# Patient Record
Sex: Female | Born: 1995
Health system: Southern US, Community
[De-identification: ages and names within clinical notes are randomized; demographics above are authoritative.]

## PROBLEM LIST (undated history)

## (undated) DIAGNOSIS — R569 Unspecified convulsions: Secondary | ICD-10-CM

## (undated) DIAGNOSIS — D219 Benign neoplasm of connective and other soft tissue, unspecified: Secondary | ICD-10-CM

## (undated) HISTORY — DX: Benign neoplasm of connective and other soft tissue, unspecified: D21.9

## (undated) HISTORY — DX: Unspecified convulsions: R56.9

## (undated) HISTORY — PX: NO PAST SURGERIES: SHX2092

---
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
## 2013-01-02 ENCOUNTER — Other Ambulatory Visit: Payer: Self-pay | Admitting: Family

## 2013-01-02 DIAGNOSIS — G40209 Localization-related (focal) (partial) symptomatic epilepsy and epileptic syndromes with complex partial seizures, not intractable, without status epilepticus: Secondary | ICD-10-CM

## 2013-01-02 DIAGNOSIS — G40309 Generalized idiopathic epilepsy and epileptic syndromes, not intractable, without status epilepticus: Secondary | ICD-10-CM

## 2013-01-06 ENCOUNTER — Other Ambulatory Visit: Payer: Self-pay

## 2013-01-06 DIAGNOSIS — G40309 Generalized idiopathic epilepsy and epileptic syndromes, not intractable, without status epilepticus: Secondary | ICD-10-CM

## 2013-01-06 DIAGNOSIS — G40209 Localization-related (focal) (partial) symptomatic epilepsy and epileptic syndromes with complex partial seizures, not intractable, without status epilepticus: Secondary | ICD-10-CM

## 2013-01-06 MED ORDER — LEVETIRACETAM 250 MG PO TABS: ORAL_TABLET | ORAL | Status: DC

## 2013-05-20 ENCOUNTER — Ambulatory Visit (INDEPENDENT_AMBULATORY_CARE_PROVIDER_SITE_OTHER): Payer: 59 | Admitting: Family Medicine

## 2013-05-20 VITALS — BP 106/74 | HR 81 | Temp 97.8°F | Resp 16 | Ht 61.0 in | Wt 109.0 lb

## 2013-05-20 DIAGNOSIS — R5383 Other fatigue: Secondary | ICD-10-CM

## 2013-05-20 DIAGNOSIS — J019 Acute sinusitis, unspecified: Secondary | ICD-10-CM

## 2013-05-20 DIAGNOSIS — R5381 Other malaise: Secondary | ICD-10-CM

## 2013-05-20 LAB — POCT URINALYSIS DIPSTICK
Bilirubin, UA: NEGATIVE
Glucose, UA: NEGATIVE
KETONES UA: NEGATIVE
LEUKOCYTES UA: NEGATIVE
Nitrite, UA: NEGATIVE
PROTEIN UA: NEGATIVE
RBC UA: NEGATIVE
Spec Grav, UA: 1.01
UROBILINOGEN UA: 0.2
pH, UA: 5.5

## 2013-05-20 LAB — POCT UA - MICROSCOPIC ONLY
BACTERIA, U MICROSCOPIC: NEGATIVE
Casts, Ur, LPF, POC: NEGATIVE
Crystals, Ur, HPF, POC: NEGATIVE
MUCUS UA: NEGATIVE
YEAST UA: NEGATIVE

## 2013-05-20 LAB — POCT URINE PREGNANCY: Preg Test, Ur: NEGATIVE

## 2013-05-20 MED ORDER — DOXYCYCLINE HYCLATE 100 MG PO CAPS: 100.0000 mg | ORAL_CAPSULE | Freq: Two times a day (BID) | ORAL | Status: DC

## 2013-05-20 NOTE — Patient Instructions (Signed)
Good to see you today- use the doxycycline as directed.  I will be in touch regarding your mono test when it comes in

## 2013-05-20 NOTE — Progress Notes (Addendum)
[facility] [address] [place] [address] [phone]  Date:  05/20/2013   Name:  Cheryl [name]   DOB:  Nov 04, 1995   MRN:  [id number]  PCP:  Lamar Blinks, MD    Chief Complaint: Sore Throat, Headache and Sinus Congestion   History of Present Illness:  Cheryl [name] is a 18 y.o. very pleasant female patient who presents with the following:  Here today with illness.  For the last couple of weeks she has noted a ST, ears feels clogged and she may hear ringing in her ears. Se also notes some pressure behind her eyes, more the left.  Vision is ok.   She is blowing some mucus out of her nose.   She has tried vitamin C.  She did not have a cough until today- today she noted a dry cough and scratchy throat.    No GI symptoms.   She has not had a fever, but has noted some chills.    She thinks she is actually a little better today.   LMP 05/13/13  History of seizure disorder. She did have a seizure (admits she was not taking her meds like she should have been) on 04/24/13.  She reports that she did report this to her neurologist.    She would like to have a urine exam "to make sure it looks ok"   Patient Active Problem List   Diagnosis Date Noted  . Localization-related (focal) (partial) epilepsy and epileptic syndromes with complex partial seizures, without mention of intractable epilepsy 11/22/2012  . Generalized convulsive epilepsy without mention of intractable epilepsy 11/22/2012    Past Medical History  Diagnosis Date  . Seizures     History reviewed. No pertinent past surgical history.  History  Substance Use Topics  . Smoking status: Never Smoker   . Smokeless tobacco: Never Used  . Alcohol Use: No    History reviewed. No pertinent family history.  No Known Allergies  Medication list has been reviewed and updated.  Current Outpatient Prescriptions on File Prior to Visit  Medication Sig Dispense Refill  . diazepam (DIASTAT ACUDIAL) 10 MG  GEL Place rectally once. Give 10mg  rectally for seizures lasting 2 minutes or longer      . levETIRAcetam (KEPPRA) 250 MG tablet Take 2 tabs by mouth every morning and 3 at bedtime  155 tablet  5   No current facility-administered medications on file prior to visit.    Review of Systems:  As per HPI- otherwise negative.   Physical Examination: Filed Vitals:   05/20/13 1237  BP: 106/74  Pulse: 81  Temp: 97.8 F (36.6 C)  Resp: 16   Filed Vitals:   05/20/13 1237  Height: 5\' 1"  (1.549 m)  Weight: 109 lb (49.442 kg)   Body mass index is 20.61 kg/(m^2). Ideal Body Weight: Weight in (lb) to have BMI = 25: 132  GEN: WDWN, NAD, Non-toxic, A & O x 3, looks well HEENT: Atraumatic, Normocephalic. Neck supple. No masses, No LAD. Bilateral TM wnl, oropharynx normal.  PEERL,EOMI.   Tender over left sinuses  Ears and Nose: No external deformity. CV: RRR, No M/G/R. No JVD. No thrill. No extra heart sounds. PULM: CTA B, no wheezes, crackles, rhonchi. No retractions. No resp. distress. No accessory muscle use. ABD: S, NT, ND, +BS. No rebound. No HSM. EXTR: No c/c/e NEURO Normal gait.  PSYCH: Normally interactive. Conversant. Not depressed or anxious appearing.  Calm demeanor.   Results for orders  placed in visit on 05/20/13  POCT UA - MICROSCOPIC ONLY      Result Value Range   WBC, Ur, HPF, POC 0-2     RBC, urine, microscopic 0-2     Bacteria, U Microscopic neg     Mucus, UA neg     Epithelial cells, urine per micros 1-3     Crystals, Ur, HPF, POC neg     Casts, Ur, LPF, POC neg     Yeast, UA neg    POCT URINALYSIS DIPSTICK      Result Value Range   Color, UA yellow     Clarity, UA clear     Glucose, UA neg     Bilirubin, UA neg     Ketones, UA neg     Spec Grav, UA 1.010     Blood, UA neg     pH, UA 5.5     Protein, UA neg     Urobilinogen, UA 0.2     Nitrite, UA neg     Leukocytes, UA Negative    POCT URINE PREGNANCY      Result Value Range   Preg Test, Ur Negative       Assessment and Plan: Sinusitis, acute - Plan: doxycycline (VIBRAMYCIN) 100 MG capsule  Other malaise and fatigue - Plan: Epstein-Barr virus VCA antibody panel, POCT UA - Microscopic Only, POCT urinalysis dipstick, POCT urine pregnancy  Probably sinusitis.  Will use doxycycline due to her history of seizures.   She would like to rule- out EBV so will do this titer for her.  Will plan further follow- up pending labs.  Signed Lamar Blinks, MD  2/11: called and LMOM.  Her EBV titer shows that she has had mono already.  Let me know if not better soon

## 2013-05-21 LAB — EPSTEIN-BARR VIRUS VCA ANTIBODY PANEL
EBV EA IgG: 8.9 U/mL (ref <9.0)
EBV NA IGG: 204 U/mL — AB (ref <18.0)
EBV VCA IgG: >750 U/mL — ABNORMAL HIGH (ref <18.0)

## 2013-08-02 ENCOUNTER — Other Ambulatory Visit: Payer: Self-pay | Admitting: Family

## 2013-12-26 ENCOUNTER — Other Ambulatory Visit: Payer: Self-pay | Admitting: Family

## 2013-12-26 ENCOUNTER — Encounter: Payer: Self-pay | Admitting: Family

## 2013-12-26 ENCOUNTER — Ambulatory Visit (INDEPENDENT_AMBULATORY_CARE_PROVIDER_SITE_OTHER): Payer: 59 | Admitting: Family

## 2013-12-26 VITALS — BP 108/76 | HR 82 | Ht 61.0 in | Wt 110.6 lb

## 2013-12-26 DIAGNOSIS — G40309 Generalized idiopathic epilepsy and epileptic syndromes, not intractable, without status epilepticus: Secondary | ICD-10-CM

## 2013-12-26 DIAGNOSIS — G40209 Localization-related (focal) (partial) symptomatic epilepsy and epileptic syndromes with complex partial seizures, not intractable, without status epilepticus: Secondary | ICD-10-CM

## 2013-12-26 MED ORDER — LEVETIRACETAM 250 MG PO TABS: ORAL_TABLET | ORAL | Status: DC

## 2013-12-26 NOTE — Progress Notes (Signed)
Patient: Cheryl [name] Cheryl [name] [name] MRN: [id number] Sex: female DOB: [birth year]  Provider: Rockwell Germany, NP Location of Care: [facility]  Note type: Routine return visit  History of Present Illness: Referral Source: Dr. Janett Billow Copland History from: patient Chief Complaint: Epilepsy  Cheryl [name] Cheryl [name] [name] is a 18 y.o. young woman with history of complex partial seizures with secondary generalization. She was last seen November 22, 2012. Cheryl [name] Cheryl [name] [name] is taking and tolerating Levetiracetam. She tells me today that she had a seizure on April 24, 2013 while at work and another on October 11, 2013. For the one in January, she said that she had gotten off schedule with her medication and was taking it late, which I suspect means that she had missed a dose because of working various shifts. 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 She said that the seizure lasted about a minute. For the seizure that occurred on July 4th, she said that she had not take her medication for about 4 days, because she was emotionally upset and didn't think about taking it. She then had a seizure that lasted about a minute. She said that she was not injured in that seizure. All of her prior seizures have occurred in the setting of missing doses of medication.   Cheryl [name] Cheryl [name] [name] had an EEG in July, 2011 that was normal. At that time, Dr. [name] talked with Cheryl [name] Cheryl [name] [name] and planned for her to wait until April, 2012 to taper off of the medication. Her Cheryl [name] [name] decided to wait until school was out to taper off Keppra. She started tapering in the summer, was off medication for one day and had a seizure on December 20, 2010. She restarted medication and has had seizures since in the setting of missed medication as mentioned.    Cheryl [name] Cheryl [name] [name] has been otherwise healthy. 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 She said that she is thinking of changing her major to business or marketing.   Review of  Systems: 12 system review was unremarkable  Past Medical History  Diagnosis Date  . Seizures    Hospitalizations: No., Head Injury: No., Nervous System Infections: No., Immunizations up to date: Yes.   Past Medical History Comments: CT scan of the brain was normal May 13, 2006 MRI scan of the brain was normal but showed extensive ethmoid and maxillary sinusitis May 14, 2006  EEG May 14, 2006 was normal awake and asleep. EEG in July 2011 was also normal.  The patient was thought to have a generalized seizure disorder, but then had focal seizures involving twitching of her eyes and gazing at the ceiling. She was originally on Depakote but switched from Depakote to Levetiracetam and had good seizure control when she is compliant with medication.  Surgical History History reviewed. No pertinent past surgical history.  Family History family history is not on file. Family History is otherwise negative for migraines, seizures, cognitive impairment, blindness, deafness, birth defects, chromosomal disorder, autism.  Social History History   Social History  . Marital Status: Single    Spouse Name: N/A    Number of Children: N/A  . Years of Education: N/A   Social History Main Topics  . Smoking status: Never Smoker   . Smokeless tobacco: Never Used  . Alcohol Use: No  . Drug Use: No  . Sexual Activity: No   Other Topics Concern  . None   Social History Narrative  . None  Educational level: Engineer, petroleum Living with:  Cheryl [name] [name] and brother  Hobbies/Interest: singing karaoke School comments:  Cheryl [name] [name] is doing well in school. She is in her freshman year at [employer] and would like to major in business.  Physical Exam BP 108/76  Pulse 82  Ht 5\' 1"  (1.549 m)  Wt 110 lb 9.6 oz (50.168 kg)  BMI 20.91 kg/m2  LMP 12/20/2013 General: alert, well developed, well nourished young woman, in no acute distress, right-handed  Head: normocephalic, no dysmorphic  features  Ears, Nose and Throat: Otoscopic: tympanic membranes normal . Pharynx: oropharynx is pink without exudates or tonsillar hypertrophy.  Neck: supple, full range of motion, no cranial or cervical bruits  Respiratory: auscultation clear  Cardiovascular: soft systolic ejection murmur at the left sternal border, pulses are normal  Musculoskeletal: no skeletal deformities or apparent scoliosis  Skin: no rashes or neurocutaneous lesions   Neurologic Exam  Mental Status: alert; oriented to person, place, and year; knowledge is normal for age; language is normal  Cranial Nerves: visual fields are full to double simultaneous stimuli; extraocular movements are full and conjugate; pupils are round reactive to light; funduscopic examination shows sharp disc margins with normal vessels; symmetric facial strength; midline tongue and uvula; hearing is intact and symmetric  Motor: Normal strength, tone, and mass; good fine motor movements; no pronator drift.  Sensory: intact responses to touch and temperature  Coordination: good finger-to-nose, rapid repetitive alternating movements and finger apposition  Gait and Station: normal gait and station; patient is able to walk on heels, toes and tandem without difficulty; balance is adequate; Romberg exam is negative; Gower response is negative  Reflexes: symmetric and diminished bilaterally; no clonus; bilateral flexor plantar responses.   Assessment and Plan Cheryl [name] is a 18 year old young woman with complex partial seizures with secondary generalization. She is taking and tolerating Levetiracetam. She had 2 seizures in the past year in the setting of missed doses of medication. I talked with her about being more compliant and she agreed to work on that. 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 Cheryl [name] [name] was undecided  what she wants to do at this point and will let me know. I will see her back in follow up in 1 year or will help her to transition to [facility] to the care of Dr Delice Lesch at that point if she chooses to do so.

## 2013-12-26 NOTE — Patient Instructions (Addendum)
Continue taking Levetiracetam 250mg  2 tablets in the morning and 3 tablets at night. Work on not missing any doses of your medication. Let me know if you have any seizures.   Please return for follow up in 1 year. If you decide that you want to transition to an adult neurology practice, let me know and I will facilitate that.

## 2014-07-02 ENCOUNTER — Other Ambulatory Visit: Payer: Self-pay | Admitting: Family

## 2014-12-28 ENCOUNTER — Ambulatory Visit: Payer: Self-pay | Admitting: Family

## 2015-01-05 ENCOUNTER — Ambulatory Visit: Payer: Self-pay | Admitting: Family

## 2015-01-11 ENCOUNTER — Encounter: Payer: Self-pay | Admitting: Family

## 2015-01-11 ENCOUNTER — Ambulatory Visit (INDEPENDENT_AMBULATORY_CARE_PROVIDER_SITE_OTHER): Payer: BLUE CROSS/BLUE SHIELD | Admitting: Family

## 2015-01-11 VITALS — BP 104/74 | HR 82 | Ht 61.0 in | Wt 110.2 lb

## 2015-01-11 DIAGNOSIS — G40209 Localization-related (focal) (partial) symptomatic epilepsy and epileptic syndromes with complex partial seizures, not intractable, without status epilepticus: Secondary | ICD-10-CM | POA: Diagnosis not present

## 2015-01-11 DIAGNOSIS — G40309 Generalized idiopathic epilepsy and epileptic syndromes, not intractable, without status epilepticus: Secondary | ICD-10-CM | POA: Diagnosis not present

## 2015-01-11 MED ORDER — LEVETIRACETAM 250 MG PO TABS: ORAL_TABLET | ORAL | Status: DC

## 2015-01-11 NOTE — Patient Instructions (Signed)
Continue taking the Levetiracetam as you have been taking it. Try not to miss any doses.   Call me if you have any seizures.   Call me in July 2017 if you have remained seizure free and want to schedule an EEG to see if you can taper off your medication.   Otherwise, please plan to return for follow up in 1 year or sooner if needed.

## 2015-01-11 NOTE — Progress Notes (Signed)
Patient: Cheryl [name] MRN: [id number] Sex: female DOB: 10-31-1995  Provider: Rockwell Germany, NP Location of Care: [facility]  Note type: Routine return visit  History of Present Illness: Referral Source: Silvestre Mesi, MD History from: mother, patient and [facility] chart Chief Complaint: Epilepsy  Cheryl [name] is a 19 y.o. young woman with history of generalized convulsive and complex partial seizures with secondary generalization. She was last seen December 26, 2013. Cheryl [name] is taking and tolerating Levetiracetam. Her last seizure occurred on October 11, 2013 in the setting of not taking her medication for 4 days prior and emotional upset. Since being on antiepileptic medication, all of her prior seizures have occurred in the setting of missing doses of medication.   Cheryl [name] had an EEG in July, 2011 that was normal. At that time, Dr. [name] talked with [name] mother and planned for her to wait until April, 2012 to taper off of the medication. Her mother decided to wait until school was out to taper off Keppra. She started tapering in the summer, was off medication for one day and had a seizure on December 20, 2010. She restarted medication and has had seizures since in the setting of missed medication as mentioned.   Cheryl [name] has been otherwise healthy. 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 She said that she is thinking of changing her major to music management and marketing. 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   Cheryl [name] and her mother ask if she could try coming off medication again, and if she she should stay on medication the rest of her life. Her mother also asked if poor sleep patterns as a very young child could have cause her seizure disorder.   Neither Cheryl [name] nor her mother have other health concerns today for her other than previously mentioned.  Review of Systems: Please see the HPI for neurologic and other pertinent review of systems.  Otherwise, the following systems are noncontributory including constitutional, eyes, ears, nose and throat, cardiovascular, respiratory, gastrointestinal, genitourinary, musculoskeletal, skin, endocrine, hematologic/lymph, allergic/immunologic and psychiatric.   Past Medical History  Diagnosis Date  . Seizures ([place])    Hospitalizations: No., Head Injury: No., Nervous System Infections: No., Immunizations up to date: Yes.   Past Medical History Comments: CT scan of the brain was normal May 13, 2006 MRI scan of the brain was normal but showed extensive ethmoid and maxillary sinusitis May 14, 2006 EEG May 14, 2006 was normal awake and asleep. EEG in July 2011 was also normal. The patient was thought to have a generalized seizure disorder, but then had focal seizures involving twitching of her eyes and gazing at the ceiling. She was originally on Depakote but switched from Depakote to Levetiracetam and had good seizure control when she is compliant with medication.  Surgical History History reviewed. No pertinent past surgical history.  Family History family history is not on file. Family History is otherwise negative for migraines, seizures, cognitive impairment, blindness, deafness, birth defects, chromosomal disorder, autism.  Social History Social History   Social History  . Marital Status: Single    Spouse Name: N/A  . Number of Children: N/A  . Years of Education: N/A   Social History Main Topics  . Smoking status: Never Smoker   . Smokeless tobacco: Never Used  . Alcohol Use: Yes     Comment: One or twice a month-Socially  . Drug Use: No  . Sexual Activity: Yes   Other  Topics Concern  . None   Social History Narrative   Cheryl [name] is currently enrolled at [employer] in her second year of college and works at [employer] 24 hours a week.   Cheryl [name] lives with her mother and sibling.   Cheryl [name] enjoys music, reading, and walking downtown.   Cheryl [name] is doing well in school.  Allergies No  Known Allergies  Physical Exam Ht 5\' 1"  (1.549 m)  Wt 110 lb 3.2 oz (49.986 kg)  BMI 20.83 kg/m2  LMP 01/08/2015 General: alert, well developed, well nourished young woman, in no acute distress, right-handed  Head: normocephalic, no dysmorphic features  Ears, Nose and Throat: Otoscopic: tympanic membranes normal . Pharynx: oropharynx is pink without exudates or tonsillar hypertrophy.  Neck: supple, full range of motion, no cranial or cervical bruits  Respiratory: auscultation clear  Cardiovascular: soft systolic ejection murmur at the left sternal border, pulses are normal  Musculoskeletal: no skeletal deformities or apparent scoliosis  Skin: no rashes or neurocutaneous lesions   Neurologic Exam  Mental Status: alert; oriented to person, place, and year; knowledge is normal for age; language is normal  Cranial Nerves: visual fields are full to double simultaneous stimuli; extraocular movements are full and conjugate; pupils are round reactive to light; funduscopic examination shows sharp disc margins with normal vessels; symmetric facial strength; midline tongue and uvula; hearing is intact and symmetric  Motor: Normal strength, tone, and mass; good fine motor movements; no pronator drift.  Sensory: intact responses to touch and temperature  Coordination: good finger-to-nose, rapid repetitive alternating movements and finger apposition  Gait and Station: normal gait and station; patient is able to walk on heels, toes and tandem without difficulty; balance is adequate; Romberg exam is negative; Gower response is negative  Reflexes: symmetric and diminished bilaterally; no clonus; bilateral flexor plantar responses.   Impression 1. Generalized convulsive epilepsy 2. Complex partial seizures with secondary generalization  Recommendations for plan of care The patient's previous [facility] were reviewed. Kelena has neither had nor required imaging or lab studies since the last  visit. She is a 19 year old young woman with complex partial seizures with secondary generalization. She is taking and tolerating Levetiracetam and her last seizure occurred in July 2015 in the setting of missed doses of medication. I talked with Cheryl [name] and her mother at some length today regarding her seizure disorder. I explained that if she remains seizure free through July 2017 that we can perform an EEG to evaluate the feasibility of her tapering off medication. I explained to her about being compliant with medication, remaining seizure free and driving privileges. If she has a normal EEG and attempts to taper off her medication next year, she will be restricted from driving for 6 months from the date that she begins the medication taper. I also talked with her about the importance of a regular sleep schedule and sleep deprivation in triggering seizures. She will continue her medication for now and will return for follow up in 1 year or sooner if needed. If she remains seizure free as of July 2017 and wants to attempt a taper off medication, I asked her to call me so that I can schedule an EEG. I also asked her to let me know if she has any seizures in the interim.  Cheryl [name] and her mother agreed with this plan.  The medication list was reviewed and reconciled.  No changes were made in the prescribed medications today.  A complete medication list was provided to  the patient.  Dr. [name] was consulted regarding the patient.   Total time spent with the patient was 30 minutes, of which 50% or more was spent in counseling and coordination of care.

## 2015-02-11 ENCOUNTER — Other Ambulatory Visit: Payer: Self-pay | Admitting: Pediatrics

## 2015-04-30 ENCOUNTER — Encounter: Payer: Self-pay | Admitting: Family Medicine

## 2015-05-05 ENCOUNTER — Encounter: Payer: Self-pay | Admitting: Family Medicine

## 2015-09-19 ENCOUNTER — Other Ambulatory Visit: Payer: Self-pay | Admitting: Family

## 2016-01-11 ENCOUNTER — Ambulatory Visit (INDEPENDENT_AMBULATORY_CARE_PROVIDER_SITE_OTHER): Payer: BLUE CROSS/BLUE SHIELD | Admitting: Family

## 2016-01-11 ENCOUNTER — Encounter (INDEPENDENT_AMBULATORY_CARE_PROVIDER_SITE_OTHER): Payer: Self-pay | Admitting: Family

## 2016-01-11 VITALS — BP 100/70 | HR 80 | Ht 60.75 in | Wt 108.5 lb

## 2016-01-11 DIAGNOSIS — G40209 Localization-related (focal) (partial) symptomatic epilepsy and epileptic syndromes with complex partial seizures, not intractable, without status epilepticus: Secondary | ICD-10-CM

## 2016-01-11 DIAGNOSIS — G40309 Generalized idiopathic epilepsy and epileptic syndromes, not intractable, without status epilepticus: Secondary | ICD-10-CM | POA: Diagnosis not present

## 2016-01-11 NOTE — Progress Notes (Signed)
Patient: Cheryl [name] MRN: [id number] Sex: female DOB: 12/06/1995  Provider: Rockwell Germany, NP Location of Care: [facility]  Note type: Routine return visit  History of Present Illness: Referral Source: Cheryl Blinks, MD History from: patient and [facility] chart Chief Complaint: Epilepsy  Cheryl [name] is a 20 y.o. young woman with history of generalized convulsive and complex partial seizures with secondary generalization. She was last seen January 11, 2015. Cheryl [name] is taking and tolerating Levetiracetam. Her last seizure occurred on October 11, 2013 in the setting of not taking her medication for 4 days prior and emotional upset. Since being on antiepileptic medication, all of her prior seizures have occurred in the setting of missing doses of medication.   Cheryl [name] had an EEG in July, 2011 that was normal. At that time, Dr. [name] talked with Cheryl [name] mother and planned for her to wait until April, 2012 to taper off of the medication. Her mother decided to wait until school was out to taper off Keppra. She started tapering in the summer, was off medication for one day and had a seizure on December 20, 2010. She restarted medication and has had seizures since in the setting of missed medication as mentioned.   Cheryl [name] has been otherwise healthy. 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 She was recently promoted to shift supervisor and has enjoyed that responsibility.   Cheryl [name] has no other health concerns today other than previously mentioned.  Review of Systems: Please see the HPI for neurologic and other pertinent review of systems. Otherwise, the following systems are noncontributory including constitutional, eyes, ears, nose and throat, cardiovascular, respiratory, gastrointestinal, genitourinary, musculoskeletal, skin,  endocrine, hematologic/lymph, allergic/immunologic and psychiatric.   Past Medical History:  Diagnosis Date  . Seizures ([facility])    Hospitalizations: No., Head Injury: No., Nervous System Infections: No., Immunizations up to date: Yes.   Past Medical History Comments: Seizures began at age 18 years. CT scan of the brain was normal May 13, 2006. MRI scan of the brain was normal but showed extensive ethmoid and maxillary sinusitis May 14, 2006 EEG May 14, 2006 was normal awake and asleep. EEG in July 2011 was also normal. The patient was thought to have a generalized seizure disorder, but then had focal seizures involving twitching of her eyes and gazing at the ceiling. She was loaded with Dilantin, then switched to Depakote. She was later changed from Depakote to Levetiracetam when seizures were not controlled. Gails has had good seizure control on Levetiracetam when she is compliant with medication.  Surgical History History reviewed. No pertinent surgical history.  Family History family history is not on file. Family History is otherwise negative for migraines, seizures, cognitive impairment, blindness, deafness, birth defects, chromosomal disorder, autism.  Social History Social History   Social History  . Marital status: Single    Spouse name: N/A  . Number of children: N/A  . Years of education: N/A   Social History Main Topics  . Smoking status: Never Smoker  . Smokeless tobacco: Never Used  . Alcohol use Yes     Comment: One or twice a month-Socially  . Drug use: No  . Sexual activity: Yes   Other Topics Concern  . None   Social History Narrative   Solomiya is currently enrolled at [employer] in  her second year of college and works at [employer] 24 hours a week.   Aquinnah lives with her mother and sibling.   Zoiee enjoys music, reading, and walking downtown.   Naavah is doing well in school.    Allergies No Known Allergies  Physical Exam BP 100/70   Pulse 80   Ht 5' 0.75"  (1.543 m)   Wt 108 lb 8 oz (49.2 kg)   LMP 12/27/2015 (Within Days)   BMI 20.67 kg/m  General: alert, well developed, well nourished young woman, in no acute distress, right-handed  Head: normocephalic, no dysmorphic features  Ears, Nose and Throat: Otoscopic: tympanic membranes normal . Pharynx: oropharynx is pink without exudates or tonsillar hypertrophy.  Neck: supple, full range of motion, no cranial or cervical bruits  Respiratory: auscultation clear  Cardiovascular: soft systolic ejection murmur at the left sternal border, pulses are normal  Musculoskeletal: no skeletal deformities or apparent scoliosis  Skin: no rashes or neurocutaneous lesions   Neurologic Exam  Mental Status: alert; oriented to person, place, and year; knowledge is normal for age; language is normal  Cranial Nerves: visual fields are full to double simultaneous stimuli; extraocular movements are full and conjugate; pupils are round reactive to light; funduscopic examination shows sharp disc margins with normal vessels; symmetric facial strength; midline tongue and uvula; hearing is intact and symmetric  Motor: Normal strength, tone, and mass; good fine motor movements; no pronator drift.  Sensory: intact responses to touch and temperature  Coordination: good finger-to-nose, rapid repetitive alternating movements and finger apposition  Gait and Station: normal gait and station; patient is able to walk on heels, toes and tandem without difficulty; balance is adequate; Romberg exam is negative; Gower response is negative  Reflexes: symmetric and diminished bilaterally; no clonus; bilateral flexor plantar responses.   Impression 1.  Generalized convulsive epilepsy 2. Complex partial seizures with secondary generalization  Recommendations for plan of care The patient's previous [facility] records were reviewed. Prarthana has neither had nor required imaging or lab studies since the last visit. She is a 20 year old  young woman with complex partial seizures with secondary generalization. She is taking and tolerating Levetiracetam for her seizure disorder. Her last seizure occurred in July 2015 in the setting of missed doses of medication. I talked with [name] about performing an EEG to determine if she could taper off medication but stressed that she would be restricted from driving for 6 months during and after the medication taper. We also talked about her history of breakthrough seizures due to medication noncompliance. Samoya decided to think about it and will let me know if she wants to have an EEG. I also talked with her about the importance of a regular sleep schedule and sleep deprivation in triggering seizures. She will continue her medication for now and will return for follow up in 1 year or sooner if needed.  asked her to let me know if she has any seizures in the interim.  [name] agreed with the plans made today.  The medication list was reviewed and reconciled.  No changes were made in the prescribed medications today.  A complete medication list was provided to the patient.     Medication List       Accurate as of 01/11/16 11:59 PM. Always use your most recent med list.          DIASTAT ACUDIAL 10 MG Gel Generic drug:  diazepam Place rectally once. Give 10mg  rectally for seizures lasting 2  minutes or longer   levETIRAcetam 250 MG tablet Commonly known as:  KEPPRA Take 2 tablets by mouth in the morning and take 3 tablets by mouth at bedtime       Dr. [name] was consulted regarding the patient.   Total time spent with the patient was 20 minutes, of which 50% or more was spent in counseling and coordination of care.   Rockwell Germany NP-C

## 2016-01-12 MED ORDER — LEVETIRACETAM 250 MG PO TABS: ORAL_TABLET | ORAL | 5 refills | Status: DC

## 2016-01-12 NOTE — Patient Instructions (Signed)
Continue taking Levetiracetam as you have been taking it. Let me know if you have any seizures. Try not to miss any doses of medication.   If you decide that you want to have an EEG to see if you can taper off medication, let me know and I will schedule that for you.   Otherwise, please plan to return for follow up in 1 year or sooner if needed.

## 2016-05-16 ENCOUNTER — Emergency Department (HOSPITAL_COMMUNITY)
Admission: EM | Admit: 2016-05-16 | Discharge: 2016-05-16 | Disposition: A | Discharge status: Home / Self Care | Payer: Self-pay | Attending: Emergency Medicine | Admitting: Emergency Medicine

## 2016-05-16 ENCOUNTER — Encounter (HOSPITAL_COMMUNITY): Payer: Self-pay | Admitting: Emergency Medicine

## 2016-05-16 DIAGNOSIS — F329 Major depressive disorder, single episode, unspecified: Secondary | ICD-10-CM | POA: Insufficient documentation

## 2016-05-16 DIAGNOSIS — S60812A Abrasion of left wrist, initial encounter: Secondary | ICD-10-CM | POA: Diagnosis not present

## 2016-05-16 DIAGNOSIS — Y998 Other external cause status: Secondary | ICD-10-CM | POA: Insufficient documentation

## 2016-05-16 DIAGNOSIS — Y92214 College as the place of occurrence of the external cause: Secondary | ICD-10-CM | POA: Insufficient documentation

## 2016-05-16 DIAGNOSIS — Z915 Personal history of self-harm: Secondary | ICD-10-CM | POA: Diagnosis not present

## 2016-05-16 DIAGNOSIS — Z7289 Other problems related to lifestyle: Secondary | ICD-10-CM

## 2016-05-16 DIAGNOSIS — Y939 Activity, unspecified: Secondary | ICD-10-CM | POA: Insufficient documentation

## 2016-05-16 DIAGNOSIS — F32A Depression, unspecified: Secondary | ICD-10-CM

## 2016-05-16 DIAGNOSIS — X789XXA Intentional self-harm by unspecified sharp object, initial encounter: Secondary | ICD-10-CM | POA: Insufficient documentation

## 2016-05-16 NOTE — Discharge Instructions (Signed)
Please follow up with resources provided. Return if worsening.

## 2016-05-16 NOTE — ED Triage Notes (Signed)
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  Patient reports this is the second time she has done this and feels she is not handling stress well. She reports using a small knife.  Denies suicidal or homicidal ideation or plan.

## 2016-05-16 NOTE — ED Provider Notes (Signed)
[facility] DEPT Provider Note   CSN: [id number] Arrival date & time: 05/16/16  1329     History   Chief Complaint Chief Complaint  Patient presents with  . superficial wrist cutting    HPI Cheryl [name] is a 21 y.o. female.  HPI Cheryl [name] is a 21 y.o. female with hx of seizures, presents to ED with complaint of depression, stress, cutting her wrist. States wrist cutting is "just to feel pain." states she feels like she is under a lot of stressors. States she has a job and goes to school at [employer]. States she has not had problems with depression before. States she does not have a therapist. States she is here for resources. She denies suicidal thoughts. Denies homocidal thoughts. Denies cutting herself in order to harm herself. No other complaints.   Past Medical History:  Diagnosis Date  . Seizures [facility])     Patient Active Problem List   Diagnosis Date Noted  . Partial epilepsy with impairment of consciousness ([place]) 11/22/2012  . Generalized convulsive epilepsy ([place]) 11/22/2012    History reviewed. No pertinent surgical history.  OB History    No data available       Home Medications    Prior to Admission medications   Medication Sig Start Date End Date Taking? Authorizing Provider  diazepam (DIASTAT ACUDIAL) 10 MG GEL Place 10 mg rectally once as needed (For seizures lasting two minutes or longer.).    Yes Historical Provider, MD  levETIRAcetam (KEPPRA) 250 MG tablet Take 2 tablets by mouth in the morning and take 3 tablets by mouth at bedtime 01/12/16  Yes Rockwell Germany, NP    Family History No family history on file.  Social History Social History  Substance Use Topics  . Smoking status: Never Smoker  . Smokeless tobacco: Never Used  . Alcohol use Yes     Comment: One or twice a month-Socially     Allergies   Patient has no known allergies.   Review of Systems Review of Systems  Constitutional: Negative for chills and fever.  Respiratory:  Negative for cough, chest tightness and shortness of breath.   Cardiovascular: Negative for chest pain, palpitations and leg swelling.  Gastrointestinal: Negative for abdominal pain, diarrhea, nausea and vomiting.  Genitourinary: Negative for dysuria, flank pain and pelvic pain.  Musculoskeletal: Negative for arthralgias, myalgias, neck pain and neck stiffness.  Skin: Negative for rash.  Neurological: Negative for dizziness, weakness and headaches.  Psychiatric/Behavioral: Positive for dysphoric mood. The patient is nervous/anxious.   All other systems reviewed and are negative.    Physical Exam Updated Vital Signs BP 141/81 (BP Location: Left Arm)   Pulse 63   Temp 97.9 F (36.6 C) (Oral)   Resp 18   LMP 05/11/2016   SpO2 98%   Physical Exam  Constitutional: She appears well-developed and well-nourished. No distress.  HENT:  Head: Normocephalic.  Eyes: Conjunctivae are normal.  Neck: Neck supple.  Cardiovascular: Normal rate, regular rhythm and normal heart sounds.   Pulmonary/Chest: Effort normal and breath sounds normal. No respiratory distress. She has no wheezes. She has no rales.  Musculoskeletal: She exhibits no edema.  Neurological: She is alert.  Skin: Skin is warm and dry.  Multiple very superficial abrasions to the anterior left wrist. Hemostatic. No evidence of infection.   Psychiatric: She has a normal mood and affect. Her behavior is normal. Judgment and thought content normal.  Nursing note and vitals reviewed.    ED Treatments /  Results  Labs (all labs ordered are listed, but only abnormal results are displayed) Labs Reviewed - No data to display  EKG  EKG Interpretation None       Radiology No results found.  Procedures Procedures (including critical care time)  Medications Ordered in ED Medications - No data to display   Initial Impression / Assessment and Plan / ED Course  I have reviewed the triage vital signs and the nursing  notes.  Pertinent labs & imaging results that were available during my care of the patient were reviewed by me and considered in my medical decision making (see chart for details).     Pt in emergency dept with superficial cuts to left wrist, reports stress, possible depression. Denies trying to harm herself. Denies SI or HI. States she would like some resources for outpatient treatment. PT was able to verbally give me a safety contract. Pt is stable for dc home with close outpatient follow up. I will provide her with resources. Return precuations and crisis line provided.   Vitals:   05/16/16 1350  BP: 141/81  Pulse: 63  Resp: 18  Temp: 97.9 F (36.6 C)  TempSrc: Oral  SpO2: 98%     Final Clinical Impressions(s) / ED Diagnoses   Final diagnoses:  Depression, unspecified depression type  Deliberate self-cutting    New Prescriptions Discharge Medication List as of 05/16/2016  2:46 PM       Jeannett Senior, PA-C 05/16/16 Rew, MD 05/20/16 [phone]

## 2016-07-24 ENCOUNTER — Other Ambulatory Visit (INDEPENDENT_AMBULATORY_CARE_PROVIDER_SITE_OTHER): Payer: Self-pay | Admitting: Family

## 2016-07-24 DIAGNOSIS — G40209 Localization-related (focal) (partial) symptomatic epilepsy and epileptic syndromes with complex partial seizures, not intractable, without status epilepticus: Secondary | ICD-10-CM

## 2016-07-24 DIAGNOSIS — G40309 Generalized idiopathic epilepsy and epileptic syndromes, not intractable, without status epilepticus: Secondary | ICD-10-CM

## 2016-08-29 ENCOUNTER — Ambulatory Visit (INDEPENDENT_AMBULATORY_CARE_PROVIDER_SITE_OTHER): Payer: BLUE CROSS/BLUE SHIELD | Admitting: Student

## 2016-08-29 ENCOUNTER — Encounter: Payer: Self-pay | Admitting: Student

## 2016-08-29 DIAGNOSIS — R300 Dysuria: Secondary | ICD-10-CM | POA: Diagnosis not present

## 2016-08-29 LAB — POCT URINALYSIS DIP (MANUAL ENTRY)
Bilirubin, UA: NEGATIVE
GLUCOSE UA: NEGATIVE mg/dL
NITRITE UA: NEGATIVE
PH UA: 5 (ref 5.0–8.0)
RBC UA: NEGATIVE
Spec Grav, UA: >=1.03 — AB (ref 1.010–1.025)
UROBILINOGEN UA: 0.2 U/dL

## 2016-08-29 LAB — POC MICROSCOPIC URINALYSIS (UMFC): Mucus: ABSENT

## 2016-08-29 MED ORDER — NITROFURANTOIN MONOHYD MACRO 100 MG PO CAPS: 100.0000 mg | ORAL_CAPSULE | Freq: Two times a day (BID) | ORAL | 0 refills | Status: DC

## 2016-08-29 NOTE — Assessment & Plan Note (Signed)
Will empirically treat with macrobid.   Will call with abnormal results. Prelim with +LE Urine culture pending May consider recheck chlamydia, although may be false + with recent diagnosis

## 2016-08-29 NOTE — Progress Notes (Signed)
   Subjective:    Patient ID: Cheryl [name], female    DOB: September 13, 1995, 21 y.o.   MRN: [id number]  HPI Presents with 4 day history of dysuria, frequency, hematuria, suprapubic pain.  She is sexually active with a female partner.  She has recently been treated for chlamydia, for which her symptoms were vaginal discharge.  She denies fevers or chills.  She has a small amount of flank pain on the left side.     PMHx - Updated and reviewed.  Contributory factors include: Negative PSHx - Updated and reviewed.  Contributory factors include:  Negative FHx - Updated and reviewed.  Contributory factors include:  Negative Social Hx - Updated and reviewed. Contributory factors include: Negative Medications - reviewed    Review of Systems  Constitutional: Negative for chills, fatigue and fever.  HENT: Negative for congestion and rhinorrhea.   Respiratory: Negative for chest tightness and shortness of breath.   Cardiovascular: Negative for chest pain and leg swelling.  Gastrointestinal: Positive for abdominal pain. Negative for constipation, diarrhea, nausea and vomiting.  Genitourinary: Positive for dysuria, flank pain, frequency, hematuria and urgency. Negative for genital sores, menstrual problem, pelvic pain, vaginal bleeding, vaginal discharge and vaginal pain.  Musculoskeletal: Negative for arthralgias and joint swelling.  Skin: Negative for rash and wound.  Psychiatric/Behavioral: Negative for agitation and confusion.  All other systems reviewed and are negative.      Objective:   Physical Exam  Constitutional: She is oriented to person, place, and time. She appears well-developed and well-nourished. No distress.  HENT:  Head: Normocephalic and atraumatic.  Right Ear: External ear normal.  Left Ear: External ear normal.  Neck: Normal range of motion. Neck supple.  Pulmonary/Chest: Effort normal. No respiratory distress.  Abdominal: Soft. Bowel sounds are normal. She exhibits no mass. There  is tenderness. There is no rebound and no guarding.  TTP at suprapubic area No CVA tenderness  Musculoskeletal: Normal range of motion. She exhibits no edema.  Neurological: She is alert and oriented to person, place, and time.  Skin: Skin is warm. No rash noted. She is not diaphoretic. No erythema.  Psychiatric: She has a normal mood and affect. Her behavior is normal. Judgment and thought content normal.  Nursing note and vitals reviewed.  BP 115/69 (BP Location: Right Arm, Patient Position: Sitting, Cuff Size: Normal)   Pulse 90   Temp 98.6 F (37 C) (Oral)   Resp 16   Ht 5' 1.25" (1.556 m)   Wt 109 lb 6.4 oz (49.6 kg)   LMP 08/07/2016   SpO2 99%   BMI 20.50 kg/m         Assessment & Plan:  Dysuria Will empirically treat with macrobid.   Will call with abnormal results. Prelim with +LE Urine culture pending May consider recheck chlamydia, although may be false + with recent diagnosis  Signed,  Balinda Quails, [facility] 5:21 PM 08/29/16

## 2016-08-30 LAB — URINE CULTURE

## 2016-09-01 ENCOUNTER — Telehealth: Payer: Self-pay | Admitting: Student

## 2016-09-01 NOTE — Telephone Encounter (Signed)
Called pt and verified name and DOB.  Gave negative urine culture results, but did look dehydrated.  Recommend increase fluids.  If still having problems, would get checked again.  She states that she is not having symptoms right now.  Signed,  Balinda Quails, DO [facility] 10:13 AM 09/01/16

## 2017-01-28 ENCOUNTER — Other Ambulatory Visit (INDEPENDENT_AMBULATORY_CARE_PROVIDER_SITE_OTHER): Payer: Self-pay | Admitting: Family

## 2017-01-28 DIAGNOSIS — G40209 Localization-related (focal) (partial) symptomatic epilepsy and epileptic syndromes with complex partial seizures, not intractable, without status epilepticus: Secondary | ICD-10-CM

## 2017-01-28 DIAGNOSIS — G40309 Generalized idiopathic epilepsy and epileptic syndromes, not intractable, without status epilepticus: Secondary | ICD-10-CM

## 2017-02-27 ENCOUNTER — Other Ambulatory Visit (INDEPENDENT_AMBULATORY_CARE_PROVIDER_SITE_OTHER): Payer: Self-pay | Admitting: Family

## 2017-02-27 DIAGNOSIS — G40209 Localization-related (focal) (partial) symptomatic epilepsy and epileptic syndromes with complex partial seizures, not intractable, without status epilepticus: Secondary | ICD-10-CM

## 2017-02-27 DIAGNOSIS — G40309 Generalized idiopathic epilepsy and epileptic syndromes, not intractable, without status epilepticus: Secondary | ICD-10-CM

## 2017-02-27 NOTE — Telephone Encounter (Signed)
Cheryl [name], Cinthia has not been seen since last October. How would you like to proceed?

## 2017-04-05 ENCOUNTER — Other Ambulatory Visit (INDEPENDENT_AMBULATORY_CARE_PROVIDER_SITE_OTHER): Payer: Self-pay | Admitting: Family

## 2017-04-05 DIAGNOSIS — G40209 Localization-related (focal) (partial) symptomatic epilepsy and epileptic syndromes with complex partial seizures, not intractable, without status epilepticus: Secondary | ICD-10-CM

## 2017-04-05 DIAGNOSIS — G40309 Generalized idiopathic epilepsy and epileptic syndromes, not intractable, without status epilepticus: Secondary | ICD-10-CM

## 2017-05-03 ENCOUNTER — Other Ambulatory Visit (INDEPENDENT_AMBULATORY_CARE_PROVIDER_SITE_OTHER): Payer: Self-pay | Admitting: Family

## 2017-05-03 DIAGNOSIS — G40209 Localization-related (focal) (partial) symptomatic epilepsy and epileptic syndromes with complex partial seizures, not intractable, without status epilepticus: Secondary | ICD-10-CM

## 2017-05-03 DIAGNOSIS — G40309 Generalized idiopathic epilepsy and epileptic syndromes, not intractable, without status epilepticus: Secondary | ICD-10-CM

## 2017-05-30 ENCOUNTER — Other Ambulatory Visit: Payer: Self-pay

## 2017-05-30 ENCOUNTER — Encounter: Payer: Self-pay | Admitting: Family Medicine

## 2017-05-30 ENCOUNTER — Ambulatory Visit (INDEPENDENT_AMBULATORY_CARE_PROVIDER_SITE_OTHER): Payer: BLUE CROSS/BLUE SHIELD | Admitting: Family Medicine

## 2017-05-30 VITALS — BP 116/60 | HR 79 | Temp 98.2°F | Resp 16 | Ht 61.25 in | Wt 114.2 lb

## 2017-05-30 DIAGNOSIS — N898 Other specified noninflammatory disorders of vagina: Secondary | ICD-10-CM

## 2017-05-30 DIAGNOSIS — Z124 Encounter for screening for malignant neoplasm of cervix: Secondary | ICD-10-CM

## 2017-05-30 DIAGNOSIS — Z13220 Encounter for screening for lipoid disorders: Secondary | ICD-10-CM | POA: Diagnosis not present

## 2017-05-30 DIAGNOSIS — Z0000 Encounter for general adult medical examination without abnormal findings: Secondary | ICD-10-CM | POA: Diagnosis not present

## 2017-05-30 DIAGNOSIS — Z1329 Encounter for screening for other suspected endocrine disorder: Secondary | ICD-10-CM

## 2017-05-30 DIAGNOSIS — Z130 Encounter for screening for diseases of the blood and blood-forming organs and certain disorders involving the immune mechanism: Secondary | ICD-10-CM

## 2017-05-30 LAB — POCT WET + KOH PREP
Trich by wet prep: ABSENT
YEAST BY WET PREP: ABSENT
Yeast by KOH: ABSENT

## 2017-05-30 MED ORDER — METRONIDAZOLE 500 MG PO TABS: 500.0000 mg | ORAL_TABLET | Freq: Two times a day (BID) | ORAL | 0 refills | Status: AC

## 2017-05-30 NOTE — Progress Notes (Signed)
Chief Complaint  Patient presents with  . Annual Exam    cpe w/ pap    Subjective:  Cheryl [name] is a 22 y.o. female here for a health maintenance visit.  Patient is established pt  Patient Active Problem List   Diagnosis Date Noted  . Dysuria 08/29/2016  . Partial epilepsy with impairment of consciousness ([place]) 11/22/2012  . Generalized convulsive epilepsy ([place]) 11/22/2012    Past Medical History:  Diagnosis Date  . Seizures ([place])     No past surgical history on file.   Outpatient Medications Prior to Visit  Medication Sig Dispense Refill  . levETIRAcetam (KEPPRA) 250 MG tablet TAKE 2 TABLET BY MOUTH EVERY MORNING AND 3 TABLET EVERY NIGHT AT BEDTIME 155 tablet 0  . diazepam (DIASTAT ACUDIAL) 10 MG GEL Place 10 mg rectally once as needed (For seizures lasting two minutes or longer.).     [name] nitrofurantoin, macrocrystal-monohydrate, (MACROBID) 100 MG capsule Take 1 capsule (100 mg total) by mouth 2 (two) times daily. 10 capsule 0   No facility-administered medications prior to visit.     No Known Allergies   No family history on file.   Health Habits: Dental Exam: not up to date Eye Exam: not up to date Exercise: 5 times/week on average at work Current exercise activities: walking/running Diet: balanced  Social History   Socioeconomic History  . Marital status: Single    Spouse name: Not on file  . Number of children: Not on file  . Years of education: Not on file  . Highest education level: Not on file  Social Needs  . Financial resource strain: Not on file  . Food insecurity - worry: Not on file  . Food insecurity - inability: Not on file  . Transportation needs - medical: Not on file  . Transportation needs - non-medical: Not on file  Occupational History  . Not on file  Tobacco Use  . Smoking status: Never Smoker  . Smokeless tobacco: Never Used  Substance and Sexual Activity  . Alcohol use: Yes    Comment: One or twice a month-Socially  . Drug  use: No  . Sexual activity: Yes  Other Topics Concern  . Not on file  Social History Narrative   Cheryl [name] is currently enrolled at [name] in her second year of college and works at [name] 24 hours a week.   [name] lives with her mother and sibling.   [name] enjoys music, reading, and walking downtown.   [name] is doing well in school.   Social History   Substance and Sexual Activity  Alcohol Use Yes   Comment: One or twice a month-Socially   Social History   Tobacco Use  Smoking Status Never Smoker  Smokeless Tobacco Never Used   Social History   Substance and Sexual Activity  Drug Use No    GYN: Sexual Health Menstrual status: regular menses LMP: Patient's last menstrual period was 05/07/2017. Last pap smear: see HM section History of abnormal pap smears:  Sexually active:  with partner Current contraception:   Health Maintenance: See under health Maintenance activity for review of completion dates as well. Immunization History  Administered Date(s) Administered  . Hepatitis A 09/04/2012     Depression Screen-PHQ2/9 Depression screen [facility] 2/9 05/30/2017 08/29/2016  Decreased Interest 0 0  Down, Depressed, Hopeless 0 0  PHQ - 2 Score 0 0     Depression Severity and Treatment Recommendations:  0-4= None  5-9= Mild / Treatment: Support, educate to  call if worse; return in one month  10-14= Moderate / Treatment: Support, watchful waiting; Antidepressant or Psycotherapy  15-19= Moderately severe / Treatment: Antidepressant OR Psychotherapy  >= 20 = Major depression, severe / Antidepressant AND Psychotherapy    Review of Systems   Review of Systems  Constitutional: Negative for chills and fever.  HENT: Negative for hearing loss and tinnitus.   Eyes: Negative for blurred vision and double vision.  Respiratory: Negative for cough, shortness of breath and wheezing.   Cardiovascular: Negative for chest pain, palpitations and leg swelling.  Gastrointestinal: Negative for  abdominal pain, constipation, diarrhea, nausea and vomiting.  Genitourinary: Negative for dysuria and urgency.  Skin: Negative for itching and rash.  Neurological: Negative for dizziness, tingling and headaches.  Psychiatric/Behavioral: Negative for depression. The patient is not nervous/anxious.     See HPI for ROS as well.    Objective:   Vitals:   05/30/17 0950  BP: 116/60  Pulse: 79  Resp: 16  Temp: 98.2 F (36.8 C)  TempSrc: Oral  SpO2: 98%  Weight: 114 lb 3.2 oz (51.8 kg)  Height: 5' 1.25" (1.556 m)    Body mass index is 21.4 kg/m.  Physical Exam  Constitutional: She is oriented to person, place, and time. She appears well-developed and well-nourished.  HENT:  Head: Normocephalic and atraumatic.  Right Ear: External ear normal.  Left Ear: External ear normal.  Nose: Nose normal.  Mouth/Throat: Oropharynx is clear and moist.  Eyes: Conjunctivae and EOM are normal. Right eye exhibits no discharge. Left eye exhibits no discharge.  Neck: Normal range of motion. No thyromegaly present.  Cardiovascular: Normal rate, regular rhythm and normal heart sounds.  No murmur heard. Pulmonary/Chest: Effort normal and breath sounds normal. No respiratory distress. She has no wheezes. She has no rales.  Abdominal: Soft. Bowel sounds are normal. She exhibits no distension. There is no tenderness. There is no rebound and no guarding.  Genitourinary: Vagina normal and uterus normal.  Genitourinary Comments: Chaperone present Pap performed  Discharge white No CMT  Musculoskeletal: Normal range of motion. She exhibits no edema.  Neurological: She is alert and oriented to person, place, and time. She has normal reflexes.  Skin: Skin is warm. No erythema.  Psychiatric: She has a normal mood and affect. Her behavior is normal. Judgment and thought content normal.   Component     Latest Ref Rng & Units 05/30/2017  Yeast by KOH     Absent Absent  Yeast by wet prep     Absent Absent   WBC by wet prep     Few Moderate (A)  Clue Cells Wet Prep HPF POC     None Few (A)  Trich by wet prep     Absent Absent  Bacteria Wet Prep HPF POC     Few Many (A)  Epithelial Cells By [employer] Pref ([facility])     None, Few, Too numerous to count Few  RBC,UR,HPF,POC     None RBC/hpf None    Assessment/Plan:   Patient was seen for a health maintenance exam.  Counseled the patient on health maintenance issues. Reviewed her health mainteance schedule and ordered appropriate tests (see orders.) Counseled on regular exercise and weight management. Recommend regular eye exams and dental cleaning.   The following issues were addressed today for health maintenance:   Donnamaria was seen today for annual exam.  Diagnoses and all orders for this visit:  Encounter for health maintenance examination in adult-  Reviewed age  appropriate screenings  Pap smear for cervical cancer screening- performed pap today -     Pap IG, CT/NG w/ reflex HPV when ASC-U  Screening for thyroid disorder -     TSH -     Comprehensive metabolic panel  Screening, anemia, deficiency, iron -     CBC  Screening, lipid -     Lipid panel  Vaginal discharge- c/w BV Sent in flagyl bid for 7 days -     POCT Wet + KOH Prep  Other orders -     Cancel: Pap IG, CT/NG NAA, and HPV (high risk) [facility]    Return in about 1 year (around 05/30/2018).    Body mass index is 21.4 kg/m.:  Discussed the patient's BMI with patient. The BMI body mass index is 21.4 kg/m.     No future appointments.  Patient Instructions       IF you received an x-ray today, you will receive an invoice from [facility]. Please contact [facility] at [phone] with questions or concerns regarding your invoice.   IF you received labwork today, you will receive an invoice from [facility]. Please contact [facility] at [phone] with questions or concerns regarding your invoice.   Our billing staff will not be able  to assist you with questions regarding bills from these companies.  You will be contacted with the lab results as soon as they are available. The fastest way to get your results is to activate your My Chart account. Instructions are located on the last page of this paperwork. If you have not heard from [place] regarding the results in 2 weeks, please contact this office.    Health Maintenance, Female Adopting a healthy lifestyle and getting preventive care can go a long way to promote health and wellness. Talk with your health care provider about what schedule of regular examinations is right for you. This is a good chance for you to check in with your provider about disease prevention and staying healthy. In between checkups, there are plenty of things you can do on your own. Experts have done a lot of research about which lifestyle changes and preventive measures are most likely to keep you healthy. Ask your health care provider for more information. Weight and diet Eat a healthy diet  Be sure to include plenty of vegetables, fruits, low-fat dairy products, and lean protein.  Do not eat a lot of foods high in solid fats, added sugars, or salt.  Get regular exercise. This is one of the most important things you can do for your health. ? Most adults should exercise for at least 150 minutes each week. The exercise should increase your heart rate and make you sweat (moderate-intensity exercise). ? Most adults should also do strengthening exercises at least twice a week. This is in addition to the moderate-intensity exercise.  Maintain a healthy weight  Body mass index (BMI) is a measurement that can be used to identify possible weight problems. It estimates body fat based on height and weight. Your health care provider can help determine your BMI and help you achieve or maintain a healthy weight.  For females 15 years of age and older: ? A BMI below 18.5 is considered underweight. ? A BMI of 18.5  to 24.9 is normal. ? A BMI of 25 to 29.9 is considered overweight. ? A BMI of 30 and above is considered obese.  Watch levels of cholesterol and blood lipids  You should start having your blood  tested for lipids and cholesterol at 24 years of age, then have this test every 5 years.  You may need to have your cholesterol levels checked more often if: ? Your lipid or cholesterol levels are high. ? You are older than 24 years of age. ? You are at high risk for heart disease.  Cancer screening Lung Cancer  Lung cancer screening is recommended for adults 80-14 years old who are at high risk for lung cancer because of a history of smoking.  A yearly low-dose CT scan of the lungs is recommended for people who: ? Currently smoke. ? Have quit within the past 15 years. ? Have at least a 30-pack-year history of smoking. A pack year is smoking an average of one pack of cigarettes a day for 1 year.  Yearly screening should continue until it has been 15 years since you quit.  Yearly screening should stop if you develop a health problem that would prevent you from having lung cancer treatment.  Breast Cancer  Practice breast self-awareness. This means understanding how your breasts normally appear and feel.  It also means doing regular breast self-exams. Let your health care provider know about any changes, no matter how small.  If you are in your 20s or 30s, you should have a clinical breast exam (CBE) by a health care provider every 1-3 years as part of a regular health exam.  If you are 59 or older, have a CBE every year. Also consider having a breast X-ray (mammogram) every year.  If you have a family history of breast cancer, talk to your health care provider about genetic screening.  If you are at high risk for breast cancer, talk to your health care provider about having an MRI and a mammogram every year.  Breast cancer gene (BRCA) assessment is recommended for women who have family  members with BRCA-related cancers. BRCA-related cancers include: ? Breast. ? Ovarian. ? Tubal. ? Peritoneal cancers.  Results of the assessment will determine the need for genetic counseling and BRCA1 and BRCA2 testing.  Cervical Cancer Your health care provider may recommend that you be screened regularly for cancer of the pelvic organs (ovaries, uterus, and vagina). This screening involves a pelvic examination, including checking for microscopic changes to the surface of your cervix (Pap test). You may be encouraged to have this screening done every 3 years, beginning at age 29.  For women ages 29-65, health care providers may recommend pelvic exams and Pap testing every 3 years, or they may recommend the Pap and pelvic exam, combined with testing for human papilloma virus (HPV), every 5 years. Some types of HPV increase your risk of cervical cancer. Testing for HPV may also be done on women of any age with unclear Pap test results.  Other health care providers may not recommend any screening for nonpregnant women who are considered low risk for pelvic cancer and who do not have symptoms. Ask your health care provider if a screening pelvic exam is right for you.  If you have had past treatment for cervical cancer or a condition that could lead to cancer, you need Pap tests and screening for cancer for at least 20 years after your treatment. If Pap tests have been discontinued, your risk factors (such as having a new sexual partner) need to be reassessed to determine if screening should resume. Some women have medical problems that increase the chance of getting cervical cancer. In these cases, your health care provider may recommend  more frequent screening and Pap tests.  Colorectal Cancer  This type of cancer can be detected and often prevented.  Routine colorectal cancer screening usually begins at 24 years of age and continues through 24 years of age.  Your health care provider may  recommend screening at an earlier age if you have risk factors for colon cancer.  Your health care provider may also recommend using home test kits to check for hidden blood in the stool.  A small camera at the end of a tube can be used to examine your colon directly (sigmoidoscopy or colonoscopy). This is done to check for the earliest forms of colorectal cancer.  Routine screening usually begins at age 43.  Direct examination of the colon should be repeated every 5-10 years through 24 years of age. However, you may need to be screened more often if early forms of precancerous polyps or small growths are found.  Skin Cancer  Check your skin from head to toe regularly.  Tell your health care provider about any new moles or changes in moles, especially if there is a change in a mole's shape or color.  Also tell your health care provider if you have a mole that is larger than the size of a pencil eraser.  Always use sunscreen. Apply sunscreen liberally and repeatedly throughout the day.  Protect yourself by wearing long sleeves, pants, a wide-brimmed hat, and sunglasses whenever you are outside.  Heart disease, diabetes, and high blood pressure  High blood pressure causes heart disease and increases the risk of stroke. High blood pressure is more likely to develop in: ? People who have blood pressure in the high end of the normal range (130-139/85-89 mm Hg). ? People who are overweight or obese. ? People who are African American.  If you are 34-47 years of age, have your blood pressure checked every 3-5 years. If you are 29 years of age or older, have your blood pressure checked every year. You should have your blood pressure measured twice-once when you are at a hospital or clinic, and once when you are not at a hospital or clinic. Record the average of the two measurements. To check your blood pressure when you are not at a hospital or clinic, you can use: ? An automated blood pressure  machine at a pharmacy. ? A home blood pressure monitor.  If you are between 45 years and 50 years old, ask your health care provider if you should take aspirin to prevent strokes.  Have regular diabetes screenings. This involves taking a blood sample to check your fasting blood sugar level. ? If you are at a normal weight and have a low risk for diabetes, have this test once every three years after 24 years of age. ? If you are overweight and have a high risk for diabetes, consider being tested at a younger age or more often. Preventing infection Hepatitis B  If you have a higher risk for hepatitis B, you should be screened for this virus. You are considered at high risk for hepatitis B if: ? You were born in a country where hepatitis B is common. Ask your health care provider which countries are considered high risk. ? Your parents were born in a high-risk country, and you have not been immunized against hepatitis B (hepatitis B vaccine). ? You have HIV or AIDS. ? You use needles to inject street drugs. ? You live with someone who has hepatitis B. ? You have had sex  with someone who has hepatitis B. ? You get hemodialysis treatment. ? You take certain medicines for conditions, including cancer, organ transplantation, and autoimmune conditions.  Hepatitis C  Blood testing is recommended for: ? Everyone born from [birth year] through 1967. ? Anyone with known risk factors for hepatitis C.  Sexually transmitted infections (STIs)  You should be screened for sexually transmitted infections (STIs) including gonorrhea and chlamydia if: ? You are sexually active and are younger than 24 years of age. ? You are older than 24 years of age and your health care provider tells you that you are at risk for this type of infection. ? Your sexual activity has changed since you were last screened and you are at an increased risk for chlamydia or gonorrhea. Ask your health care provider if you are at  risk.  If you do not have HIV, but are at risk, it may be recommended that you take a prescription medicine daily to prevent HIV infection. This is called pre-exposure prophylaxis (PrEP). You are considered at risk if: ? You are sexually active and do not regularly use condoms or know the HIV status of your partner(s). ? You take drugs by injection. ? You are sexually active with a partner who has HIV.  Talk with your health care provider about whether you are at high risk of being infected with HIV. If you choose to begin PrEP, you should first be tested for HIV. You should then be tested every 3 months for as long as you are taking PrEP. Pregnancy  If you are premenopausal and you may become pregnant, ask your health care provider about preconception counseling.  If you may become pregnant, take 400 to 800 micrograms (mcg) of folic acid every day.  If you want to prevent pregnancy, talk to your health care provider about birth control (contraception). Osteoporosis and menopause  Osteoporosis is a disease in which the bones lose minerals and strength with aging. This can result in serious bone fractures. Your risk for osteoporosis can be identified using a bone density scan.  If you are 77 years of age or older, or if you are at risk for osteoporosis and fractures, ask your health care provider if you should be screened.  Ask your health care provider whether you should take a calcium or vitamin D supplement to lower your risk for osteoporosis.  Menopause may have certain physical symptoms and risks.  Hormone replacement therapy may reduce some of these symptoms and risks. Talk to your health care provider about whether hormone replacement therapy is right for you. Follow these instructions at home:  Schedule regular health, dental, and eye exams.  Stay current with your immunizations.  Do not use any tobacco products including cigarettes, chewing tobacco, or electronic  cigarettes.  If you are pregnant, do not drink alcohol.  If you are breastfeeding, limit how much and how often you drink alcohol.  Limit alcohol intake to no more than 1 drink per day for nonpregnant women. One drink equals 12 ounces of beer, 5 ounces of wine, or 1 ounces of hard liquor.  Do not use street drugs.  Do not share needles.  Ask your health care provider for help if you need support or information about quitting drugs.  Tell your health care provider if you often feel depressed.  Tell your health care provider if you have ever been abused or do not feel safe at home. This information is not intended to replace advice given to you  by your health care provider. Make sure you discuss any questions you have with your health care provider. Document Released: 10/10/2010 Document Revised: 09/02/2015 Document Reviewed: 12/29/2014 Elsevier Interactive Patient Education  [employer].

## 2017-05-30 NOTE — Patient Instructions (Addendum)
   IF you received an x-ray today, you will receive an invoice from [facility]. Please contact [facility] at [phone] with questions or concerns regarding your invoice.   IF you received labwork today, you will receive an invoice from [facility]. Please contact [facility] at [phone] with questions or concerns regarding your invoice.   Our billing staff will not be able to assist you with questions regarding bills from these companies.  You will be contacted with the lab results as soon as they are available. The fastest way to get your results is to activate your My Chart account. Instructions are located on the last page of this paperwork. If you have not heard from us regarding the results in 2 weeks, please contact this office.    Health Maintenance, Female Adopting a healthy lifestyle and getting preventive care can go a long way to promote health and wellness. Talk with your health care provider about what schedule of regular examinations is right for you. This is a good chance for you to check in with your provider about disease prevention and staying healthy. In between checkups, there are plenty of things you can do on your own. Experts have done a lot of research about which lifestyle changes and preventive measures are most likely to keep you healthy. Ask your health care provider for more information. Weight and diet Eat a healthy diet  Be sure to include plenty of vegetables, fruits, low-fat dairy products, and lean protein.  Do not eat a lot of foods high in solid fats, added sugars, or salt.  Get regular exercise. This is one of the most important things you can do for your health. ? Most adults should exercise for at least 150 minutes each week. The exercise should increase your heart rate and make you sweat (moderate-intensity exercise). ? Most adults should also do strengthening exercises at least twice a week. This is in addition to the  moderate-intensity exercise.  Maintain a healthy weight  Body mass index (BMI) is a measurement that can be used to identify possible weight problems. It estimates body fat based on height and weight. Your health care provider can help determine your BMI and help you achieve or maintain a healthy weight.  For females 20 years of age and older: ? A BMI below 18.5 is considered underweight. ? A BMI of 18.5 to 24.9 is normal. ? A BMI of 25 to 29.9 is considered overweight. ? A BMI of 30 and above is considered obese.  Watch levels of cholesterol and blood lipids  You should start having your blood tested for lipids and cholesterol at 24 years of age, then have this test every 5 years.  You may need to have your cholesterol levels checked more often if: ? Your lipid or cholesterol levels are high. ? You are older than 24 years of age. ? You are at high risk for heart disease.  Cancer screening Lung Cancer  Lung cancer screening is recommended for adults 55-80 years old who are at high risk for lung cancer because of a history of smoking.  A yearly low-dose CT scan of the lungs is recommended for people who: ? Currently smoke. ? Have quit within the past 15 years. ? Have at least a 30-pack-year history of smoking. A pack year is smoking an average of one pack of cigarettes a day for 1 year.  Yearly screening should continue until it has been 15 years since you quit.  Yearly screening   should stop if you develop a health problem that would prevent you from having lung cancer treatment.  Breast Cancer  Practice breast self-awareness. This means understanding how your breasts normally appear and feel.  It also means doing regular breast self-exams. Let your health care provider know about any changes, no matter how small.  If you are in your 20s or 30s, you should have a clinical breast exam (CBE) by a health care provider every 1-3 years as part of a regular health exam.  If you  are 42 or older, have a CBE every year. Also consider having a breast X-ray (mammogram) every year.  If you have a family history of breast cancer, talk to your health care provider about genetic screening.  If you are at high risk for breast cancer, talk to your health care provider about having an MRI and a mammogram every year.  Breast cancer gene (BRCA) assessment is recommended for women who have family members with BRCA-related cancers. BRCA-related cancers include: ? Breast. ? Ovarian. ? Tubal. ? Peritoneal cancers.  Results of the assessment will determine the need for genetic counseling and BRCA1 and BRCA2 testing.  Cervical Cancer Your health care provider may recommend that you be screened regularly for cancer of the pelvic organs (ovaries, uterus, and vagina). This screening involves a pelvic examination, including checking for microscopic changes to the surface of your cervix (Pap test). You may be encouraged to have this screening done every 3 years, beginning at age 56.  For women ages 41-65, health care providers may recommend pelvic exams and Pap testing every 3 years, or they may recommend the Pap and pelvic exam, combined with testing for human papilloma virus (HPV), every 5 years. Some types of HPV increase your risk of cervical cancer. Testing for HPV may also be done on women of any age with unclear Pap test results.  Other health care providers may not recommend any screening for nonpregnant women who are considered low risk for pelvic cancer and who do not have symptoms. Ask your health care provider if a screening pelvic exam is right for you.  If you have had past treatment for cervical cancer or a condition that could lead to cancer, you need Pap tests and screening for cancer for at least 20 years after your treatment. If Pap tests have been discontinued, your risk factors (such as having a new sexual partner) need to be reassessed to determine if screening should  resume. Some women have medical problems that increase the chance of getting cervical cancer. In these cases, your health care provider may recommend more frequent screening and Pap tests.  Colorectal Cancer  This type of cancer can be detected and often prevented.  Routine colorectal cancer screening usually begins at 24 years of age and continues through 24 years of age.  Your health care provider may recommend screening at an earlier age if you have risk factors for colon cancer.  Your health care provider may also recommend using home test kits to check for hidden blood in the stool.  A small camera at the end of a tube can be used to examine your colon directly (sigmoidoscopy or colonoscopy). This is done to check for the earliest forms of colorectal cancer.  Routine screening usually begins at age 16.  Direct examination of the colon should be repeated every 5-10 years through 24 years of age. However, you may need to be screened more often if early forms of precancerous polyps or  small growths are found.  Skin Cancer  Check your skin from head to toe regularly.  Tell your health care provider about any new moles or changes in moles, especially if there is a change in a mole's shape or color.  Also tell your health care provider if you have a mole that is larger than the size of a pencil eraser.  Always use sunscreen. Apply sunscreen liberally and repeatedly throughout the day.  Protect yourself by wearing long sleeves, pants, a wide-brimmed hat, and sunglasses whenever you are outside.  Heart disease, diabetes, and high blood pressure  High blood pressure causes heart disease and increases the risk of stroke. High blood pressure is more likely to develop in: ? People who have blood pressure in the high end of the normal range (130-139/85-89 mm Hg). ? People who are overweight or obese. ? People who are African American.  If you are 61-36 years of age, have your blood  pressure checked every 3-5 years. If you are 6 years of age or older, have your blood pressure checked every year. You should have your blood pressure measured twice-once when you are at a hospital or clinic, and once when you are not at a hospital or clinic. Record the average of the two measurements. To check your blood pressure when you are not at a hospital or clinic, you can use: ? An automated blood pressure machine at a pharmacy. ? A home blood pressure monitor.  If you are between 7 years and 29 years old, ask your health care provider if you should take aspirin to prevent strokes.  Have regular diabetes screenings. This involves taking a blood sample to check your fasting blood sugar level. ? If you are at a normal weight and have a low risk for diabetes, have this test once every three years after 24 years of age. ? If you are overweight and have a high risk for diabetes, consider being tested at a younger age or more often. Preventing infection Hepatitis B  If you have a higher risk for hepatitis B, you should be screened for this virus. You are considered at high risk for hepatitis B if: ? You were born in a country where hepatitis B is common. Ask your health care provider which countries are considered high risk. ? Your parents were born in a high-risk country, and you have not been immunized against hepatitis B (hepatitis B vaccine). ? You have HIV or AIDS. ? You use needles to inject street drugs. ? You live with someone who has hepatitis B. ? You have had sex with someone who has hepatitis B. ? You get hemodialysis treatment. ? You take certain medicines for conditions, including cancer, organ transplantation, and autoimmune conditions.  Hepatitis C  Blood testing is recommended for: ? Everyone born from [birth year] through 1967. ? Anyone with known risk factors for hepatitis C.  Sexually transmitted infections (STIs)  You should be screened for sexually transmitted  infections (STIs) including gonorrhea and chlamydia if: ? You are sexually active and are younger than 24 years of age. ? You are older than 24 years of age and your health care provider tells you that you are at risk for this type of infection. ? Your sexual activity has changed since you were last screened and you are at an increased risk for chlamydia or gonorrhea. Ask your health care provider if you are at risk.  If you do not have HIV, but are at risk, it  may be recommended that you take a prescription medicine daily to prevent HIV infection. This is called pre-exposure prophylaxis (PrEP). You are considered at risk if: ? You are sexually active and do not regularly use condoms or know the HIV status of your partner(s). ? You take drugs by injection. ? You are sexually active with a partner who has HIV.  Talk with your health care provider about whether you are at high risk of being infected with HIV. If you choose to begin PrEP, you should first be tested for HIV. You should then be tested every 3 months for as long as you are taking PrEP. Pregnancy  If you are premenopausal and you may become pregnant, ask your health care provider about preconception counseling.  If you may become pregnant, take 400 to 800 micrograms (mcg) of folic acid every day.  If you want to prevent pregnancy, talk to your health care provider about birth control (contraception). Osteoporosis and menopause  Osteoporosis is a disease in which the bones lose minerals and strength with aging. This can result in serious bone fractures. Your risk for osteoporosis can be identified using a bone density scan.  If you are 65 years of age or older, or if you are at risk for osteoporosis and fractures, ask your health care provider if you should be screened.  Ask your health care provider whether you should take a calcium or vitamin D supplement to lower your risk for osteoporosis.  Menopause may have certain physical  symptoms and risks.  Hormone replacement therapy may reduce some of these symptoms and risks. Talk to your health care provider about whether hormone replacement therapy is right for you. Follow these instructions at home:  Schedule regular health, dental, and eye exams.  Stay current with your immunizations.  Do not use any tobacco products including cigarettes, chewing tobacco, or electronic cigarettes.  If you are pregnant, do not drink alcohol.  If you are breastfeeding, limit how much and how often you drink alcohol.  Limit alcohol intake to no more than 1 drink per day for nonpregnant women. One drink equals 12 ounces of beer, 5 ounces of wine, or 1 ounces of hard liquor.  Do not use street drugs.  Do not share needles.  Ask your health care provider for help if you need support or information about quitting drugs.  Tell your health care provider if you often feel depressed.  Tell your health care provider if you have ever been abused or do not feel safe at home. This information is not intended to replace advice given to you by your health care provider. Make sure you discuss any questions you have with your health care provider. Document Released: 10/10/2010 Document Revised: 09/02/2015 Document Reviewed: 12/29/2014 Elsevier Interactive Patient Education  2018 Elsevier Inc.  

## 2017-05-31 LAB — COMPREHENSIVE METABOLIC PANEL
ALT: 9 IU/L (ref 0–32)
AST: 17 IU/L (ref 0–40)
Albumin/Globulin Ratio: 1.6 (ref 1.2–2.2)
Albumin: 4.1 g/dL (ref 3.5–5.5)
Alkaline Phosphatase: 68 IU/L (ref 39–117)
BUN/Creatinine Ratio: 19 (ref 9–23)
BUN: 14 mg/dL (ref 6–20)
CHLORIDE: 103 mmol/L (ref 96–106)
CO2: 22 mmol/L (ref 20–29)
Calcium: 9.2 mg/dL (ref 8.7–10.2)
Creatinine, Ser: 0.74 mg/dL (ref 0.57–1.00)
GFR calc Af Amer: 132 mL/min/{1.73_m2} (ref >59)
GFR calc non Af Amer: 115 mL/min/{1.73_m2} (ref >59)
GLOBULIN, TOTAL: 2.6 g/dL (ref 1.5–4.5)
Glucose: 88 mg/dL (ref 65–99)
Potassium: 4.1 mmol/L (ref 3.5–5.2)
SODIUM: 138 mmol/L (ref 134–144)
Total Protein: 6.7 g/dL (ref 6.0–8.5)

## 2017-05-31 LAB — CBC
HEMATOCRIT: 30.8 % — AB (ref 34.0–46.6)
HEMOGLOBIN: 9.9 g/dL — AB (ref 11.1–15.9)
MCH: 24.1 pg — ABNORMAL LOW (ref 26.6–33.0)
MCHC: 32.1 g/dL (ref 31.5–35.7)
MCV: 75 fL — AB (ref 79–97)
Platelets: 328 10*3/uL (ref 150–379)
RBC: 4.1 x10E6/uL (ref 3.77–5.28)
RDW: 17.1 % — ABNORMAL HIGH (ref 12.3–15.4)
WBC: 5.9 10*3/uL (ref 3.4–10.8)

## 2017-05-31 LAB — LIPID PANEL
CHOL/HDL RATIO: 2.1 ratio (ref 0.0–4.4)
CHOLESTEROL TOTAL: 128 mg/dL (ref 100–199)
HDL: 62 mg/dL (ref >39)
LDL CALC: 56 mg/dL (ref 0–99)
TRIGLYCERIDES: 49 mg/dL (ref 0–149)
VLDL CHOLESTEROL CAL: 10 mg/dL (ref 5–40)

## 2017-05-31 LAB — TSH: TSH: 1.99 u[IU]/mL (ref 0.450–4.500)

## 2017-06-01 ENCOUNTER — Other Ambulatory Visit (INDEPENDENT_AMBULATORY_CARE_PROVIDER_SITE_OTHER): Payer: Self-pay | Admitting: Family

## 2017-06-01 DIAGNOSIS — G40209 Localization-related (focal) (partial) symptomatic epilepsy and epileptic syndromes with complex partial seizures, not intractable, without status epilepticus: Secondary | ICD-10-CM

## 2017-06-01 DIAGNOSIS — G40309 Generalized idiopathic epilepsy and epileptic syndromes, not intractable, without status epilepticus: Secondary | ICD-10-CM

## 2017-06-01 LAB — PAP IG, CT-NG, RFX HPV ASCU
Chlamydia, Nuc. Acid Amp: NEGATIVE
Gonococcus by Nucleic Acid Amp: NEGATIVE
PAP Smear Comment: 0

## 2017-06-08 ENCOUNTER — Encounter: Payer: Self-pay | Admitting: Family Medicine

## 2017-06-13 ENCOUNTER — Other Ambulatory Visit (INDEPENDENT_AMBULATORY_CARE_PROVIDER_SITE_OTHER): Payer: Self-pay | Admitting: Family

## 2017-06-13 DIAGNOSIS — G40309 Generalized idiopathic epilepsy and epileptic syndromes, not intractable, without status epilepticus: Secondary | ICD-10-CM

## 2017-06-13 DIAGNOSIS — G40209 Localization-related (focal) (partial) symptomatic epilepsy and epileptic syndromes with complex partial seizures, not intractable, without status epilepticus: Secondary | ICD-10-CM

## 2017-06-14 ENCOUNTER — Encounter (INDEPENDENT_AMBULATORY_CARE_PROVIDER_SITE_OTHER): Payer: Self-pay | Admitting: Family

## 2017-06-14 ENCOUNTER — Ambulatory Visit (INDEPENDENT_AMBULATORY_CARE_PROVIDER_SITE_OTHER): Payer: BLUE CROSS/BLUE SHIELD | Admitting: Family

## 2017-06-14 DIAGNOSIS — G40209 Localization-related (focal) (partial) symptomatic epilepsy and epileptic syndromes with complex partial seizures, not intractable, without status epilepticus: Secondary | ICD-10-CM | POA: Diagnosis not present

## 2017-06-14 DIAGNOSIS — G40309 Generalized idiopathic epilepsy and epileptic syndromes, not intractable, without status epilepticus: Secondary | ICD-10-CM

## 2017-06-14 MED ORDER — LEVETIRACETAM 250 MG PO TABS: ORAL_TABLET | ORAL | 5 refills | Status: DC

## 2017-06-14 NOTE — Patient Instructions (Signed)
Thank you for coming in today.   Instructions for you until your next appointment are as follows: 1. I have sent in a refill of your medication. Try not to miss any doses. Talk to the pharmacy about automatic refill reminders so that you will not run out.  2. Let me know if you have any more seizures.  3. Please sign up for MyChart if you have not done so 4. Please plan to return for follow up in one year or sooner if needed.

## 2017-06-14 NOTE — Progress Notes (Signed)
Patient: Cheryl [name] MRN: [id number] Sex: female DOB: 07-07-1995  Provider: Rockwell Germany, NP Location of Care: [facility]  Note type: Routine return visit  History of Present Illness: Referral Source: Cheryl Blinks, MD History from: patient and [facility] chart Chief Complaint: Epilepsy  Cheryl [name] is a 22 y.o. young woman with history of generalized convulsive and complex partial seizures with secondary generalization. She was last seen January 11, 2016 and came in at my request in order authorize ongoing refills of her medication. Cheryl [name] is taking and tolerating Levetiracetam for her seizure disorder. She has had seizures in the past in the setting of missed doses of medication. Cheryl [name] tells me today that she had a 2 minute convulsive seizure two days ago after running out of medication and missing "3 or 4 doses". After the seizure she picked up a refill of the Levetiracetam and restarted taking it. She has been seizure free since restarting the medication.   Cheryl [name] has been otherwise healthy since she was last seen. 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 She would like to join a branch of the military to do that work but says that she is unsure if she would be accepted with her seizure disorder. Cheryl [name] asked today if she could taper off medication because she feels that being off medication would give her more prospects with her goal of [employer] service. [name] has no other health concerns today other than previously mentioned.  Review of Systems: Please see the HPI for neurologic and other pertinent review of systems. Otherwise, all other systems were reviewed and were negative.    Past Medical History:  Diagnosis Date  . Seizures ([place])    Hospitalizations: No., Head Injury: No., Nervous System Infections: No., Immunizations up to date: Yes.   Past Medical History Comments: Seizures began at age 49 years. CT scan of the brain was normal  May 13, 2006. MRI scan of the brain was normal but showed extensive ethmoid and maxillary sinusitis May 14, 2006 EEG May 14, 2006 was normal awake and asleep. EEG in July 2011 was also normal. The patient was thought to have a generalized seizure disorder, but then had focal seizures involving twitching of her eyes and gazing at the ceiling. She was loaded with Dilantin, then switched to Depakote. She was later changed from Depakote to Levetiracetam when seizures were not controlled. Cheryl [name] has had good seizure control on Levetiracetam when she is compliant with medication.    Cheryl [name] had an EEG in July, 2011 that was normal. At that time, Cheryl [name] talked with [name] mother and planned for her to wait until April, 2012 to taper off of the medication. Her mother decided to wait until school was out to taper off Keppra. She started tapering in the summer, was off medication for one day and had a seizure on December 20, 2010. She restarted medication and has had seizures since in the setting of missed medication as mentioned   Surgical History Past Surgical History:  Procedure Laterality Date  . NO PAST SURGERIES      Family History family history is not on file. Family History is otherwise negative for migraines, seizures, cognitive impairment, blindness, deafness, birth defects, chromosomal disorder, autism.  Social History Social History   Socioeconomic History  . Marital status: Single    Spouse name: None  . Number of children: None  . Years of education: None  . Highest education level:  None  Social Needs  . Financial resource strain: None  . Food insecurity - worry: None  . Food insecurity - inability: None  . Transportation needs - medical: None  . Transportation needs - non-medical: None  Occupational History  . None  Tobacco Use  . Smoking status: Never Smoker  . Smokeless tobacco: Never Used  Substance and Sexual Activity  . Alcohol use: Yes    Comment:  One or twice a month-Socially  . Drug use: No  . Sexual activity: Yes  Other Topics Concern  . None  Social History Narrative   [name] is currently enrolled at [employer] in her second year of college and works at [employer] 24 hours a week.   [name] lives with her mother and sibling.   [name] enjoys music, reading, and walking downtown.   [name] is doing well in school.    Allergies No Known Allergies  Physical Exam Ht 5\' 1"  (1.549 m)   Wt 115 lb 12.8 oz (52.5 kg)   BMI 21.88 kg/m  General: well developed, well nourished young woman, seated on exam table, in no evident distress; black hair, brown eyes, right handed Head: normocephalic and atraumatic. Oropharynx benign. No dysmorphic features. Neck: supple with no carotid bruits. No focal tenderness. Cardiovascular: regular rate and rhythm, no murmurs. Respiratory: Clear to auscultation bilaterally Abdomen: Bowel sounds present all four quadrants, abdomen soft, non-tender, non-distended. No hepatosplenomegaly or masses palpated. Musculoskeletal: No skeletal deformities or obvious scoliosis Skin: no rashes or neurocutaneous lesions  Neurologic Exam Mental Status: Awake and fully alert.  Attention span, concentration, and fund of knowledge appropriate for age.  Speech fluent without dysarthria.  Able to follow commands and participate in examination. Cranial Nerves: Fundoscopic exam - red reflex present.  Unable to fully visualize fundus.  Pupils equal briskly reactive to light.  Extraocular movements full without nystagmus.  Visual fields full to confrontation.  Hearing intact and symmetric to finger rub.  Facial sensation intact.  [name], tongue, palate move normally and symmetrically.  Neck flexion and extension normal. Motor: Normal bulk and tone.  Normal strength in all tested extremity muscles. Sensory: Intact to touch and temperature in all extremities. Coordination: Rapid movements: finger and toe tapping normal and symmetric bilaterally.   Finger-to-nose and heel-to-shin intact bilaterally.  Able to balance on either foot. Romberg negative. Gait and Station: Arises from chair, without difficulty. Stance is normal.  Gait demonstrates normal stride length and balance. Able to run and walk normally. Able to hop. Able to heel, toe and tandem walk without difficulty. Reflexes: Diminished and symmetric. Toes downgoing. No clonus.  Impression 1.  Generalized convulsive epilepsy 2.  Complex partial seizures with secondary generalization  Recommendations for plan of care The patient's previous [facility] records were reviewed. Mistey has neither had nor required imaging or lab studies since the last visit. She is a 22 year old young woman with history of generalized convulsive and complex partial seizures with secondary generalization. She is taking and tolerating Levetiracetam for her seizure disorder. Nuriya has had seizures in the setting of missed doses and in fact had a seizure earlier this week when she had missed several doses of medication. I talked with Cheryl [name] about the need for compliance with her medication and about regular visits in this office for medication management. She asked if she could attempt to taper off medication and I told her that would not be advisable at this time since she just had a seizure 2 days ago in the setting  of missed doses. I told her that I would order an EEG and consider tapering medication after she has been seizure free for 2 years. Finally, I talked with Cheryl [name] about her desire to enlist in a branch of the [employer]. I recommended that she talk with a recruiting office to see if she would be a candidate. Nahomi is also interested in completing courses at [employer] to be an Conservation officer, nature and I told her that I was unaware of any reason that her seizure disorder would limit that goal. I asked Karliah to return for follow up in 1 year or sooner if needed. Cheryl [name] agreed with plans made today.   The medication list was reviewed and  reconciled.  No changes were made in the prescribed medications today.  A complete medication list was provided to the patient.  Allergies as of 06/14/2017   No Known Allergies     Medication List        Accurate as of 06/14/17 12:20 PM. Always use your most recent med list.          levETIRAcetam 250 MG tablet Commonly known as:  KEPPRA TAKE 2 TABLET BY MOUTH EVERY MORNING AND 3 TABLET EVERY NIGHT AT BEDTIME       Cheryl [name] was consulted regarding the patient.   Total time spent with the patient was  20 minutes, of which 50% or more was spent in counseling and coordination of care.   Rockwell Germany NP-C

## 2017-07-23 ENCOUNTER — Telehealth (INDEPENDENT_AMBULATORY_CARE_PROVIDER_SITE_OTHER): Payer: Self-pay | Admitting: Family

## 2017-07-23 NOTE — Telephone Encounter (Signed)
Who's calling (name and relationship to patient) : [name] (Self) Best contact number: [phone] (H) Provider they see: Rockwell Germany, NP Reason for call: Patient calling in regards to some questions she had about her last visit with Otila Kluver. Patient requested to just have Otila Kluver to give her a call.

## 2017-07-24 NOTE — Telephone Encounter (Signed)
I called Cheryl [name] and left a message inviting her to call back. TG

## 2017-07-27 ENCOUNTER — Other Ambulatory Visit: Payer: Self-pay

## 2017-07-27 ENCOUNTER — Encounter: Payer: Self-pay | Admitting: Family Medicine

## 2017-07-27 ENCOUNTER — Ambulatory Visit: Payer: BLUE CROSS/BLUE SHIELD | Admitting: Family Medicine

## 2017-07-27 VITALS — BP 110/68 | HR 72 | Temp 98.0°F | Resp 16 | Ht 61.42 in | Wt 113.0 lb

## 2017-07-27 DIAGNOSIS — N898 Other specified noninflammatory disorders of vagina: Secondary | ICD-10-CM | POA: Diagnosis not present

## 2017-07-27 DIAGNOSIS — Z113 Encounter for screening for infections with a predominantly sexual mode of transmission: Secondary | ICD-10-CM

## 2017-07-27 DIAGNOSIS — B9689 Other specified bacterial agents as the cause of diseases classified elsewhere: Secondary | ICD-10-CM | POA: Diagnosis not present

## 2017-07-27 DIAGNOSIS — R350 Frequency of micturition: Secondary | ICD-10-CM

## 2017-07-27 DIAGNOSIS — N760 Acute vaginitis: Secondary | ICD-10-CM

## 2017-07-27 LAB — POCT WET + KOH PREP
Trich by wet prep: ABSENT
YEAST BY KOH: ABSENT
Yeast by wet prep: ABSENT

## 2017-07-27 LAB — POCT URINALYSIS DIP (MANUAL ENTRY)
Bilirubin, UA: NEGATIVE
Blood, UA: NEGATIVE
Glucose, UA: NEGATIVE mg/dL
Ketones, POC UA: NEGATIVE mg/dL
LEUKOCYTES UA: NEGATIVE
NITRITE UA: NEGATIVE
Protein Ur, POC: NEGATIVE mg/dL
Spec Grav, UA: 1.02 (ref 1.010–1.025)
UROBILINOGEN UA: 1 U/dL
pH, UA: 8 (ref 5.0–8.0)

## 2017-07-27 LAB — POC MICROSCOPIC URINALYSIS (UMFC): Mucus: ABSENT

## 2017-07-27 MED ORDER — METRONIDAZOLE 500 MG PO TABS: ORAL_TABLET | ORAL | 0 refills | Status: DC

## 2017-07-27 NOTE — Progress Notes (Signed)
Subjective:  By signing my name below, I, Cheryl [name], attest that this documentation has been prepared under the direction and in the presence of [name], MD. Electronically Signed: [name], [place]. 07/27/2017 , 6:30 PM .  Patient was seen in Room 11 .   Patient ID: Cheryl [name], female    DOB: 1995-09-23, 22 y.o.   MRN: [id number] Chief Complaint  Patient presents with  . Urinary Frequency    with some discoloration x 3 days    HPI Cheryl [name] is a 22 y.o. female  Patient complains of urinary frequency that started 3 days ago with some darker yellow discoloration. She mentions having slight dysuria, and mentions new vaginal discharge that started 4 days ago. She notes history of BV 6 months ago, and chlamydia 1 year ago; denies any symptoms of chlamydia recently. She is sexually active with females; most recent partner been together about 3 months now. She informs her partner recently had UTI. She denies muscle aches or abdominal pain. She declines STI testing today.   She used to work for [employer], and now [employer], as an Producer, television/film/video.   She was majoring in music, and plays guitar.   Patient Active Problem List   Diagnosis Date Noted  . Dysuria 08/29/2016  . Partial epilepsy with impairment of consciousness ([place]) 11/22/2012  . Generalized convulsive epilepsy ([place]) 11/22/2012   Past Medical History:  Diagnosis Date  . Seizures ([place])    Past Surgical History:  Procedure Laterality Date  . NO PAST SURGERIES     No Known Allergies Prior to Admission medications   Medication Sig Start Date End Date Taking? Authorizing Provider  levETIRAcetam (KEPPRA) 250 MG tablet TAKE 2 TABLETS BY MOUTH EVERY MORNING AND 3 TABLETS EVERY NIGHT AT BEDTIME 06/14/17   Rockwell Germany, NP   Social History   Socioeconomic History  . Marital status: Single    Spouse name: Not on file  . Number of children: Not on file  . Years of education: Not on file  . Highest education level: Not on file    Occupational History  . Not on file  Social Needs  . Financial resource strain: Not on file  . Food insecurity:    Worry: Not on file    Inability: Not on file  . Transportation needs:    Medical: Not on file    Non-medical: Not on file  Tobacco Use  . Smoking status: Never Smoker  . Smokeless tobacco: Never Used  Substance and Sexual Activity  . Alcohol use: Yes    Comment: One or twice a month-Socially  . Drug use: No  . Sexual activity: Yes  Lifestyle  . Physical activity:    Days per week: Not on file    Minutes per session: Not on file  . Stress: Not on file  Relationships  . Social connections:    Talks on phone: Not on file    Gets together: Not on file    Attends religious service: Not on file    Active member of club or organization: Not on file    Attends meetings of clubs or organizations: Not on file    Relationship status: Not on file  . Intimate partner violence:    Fear of current or ex partner: Not on file    Emotionally abused: Not on file    Physically abused: Not on file    Forced sexual activity: Not on file  Other Topics Concern  . Not on  file  Social History Narrative   Cheryl [name] is currently enrolled at [employer] in her second year of college and works at [employer] 24 hours a week.   Cheryl [name] lives with her mother and sibling.   Cheryl [name] enjoys music, reading, and walking downtown.   Cheryl [name] is doing well in school.   Review of Systems  Constitutional: Negative for chills, fatigue, fever and unexpected weight change.  Respiratory: Negative for cough.   Gastrointestinal: Negative for abdominal pain, constipation, diarrhea, nausea and vomiting.  Genitourinary: Positive for frequency and vaginal discharge. Negative for hematuria and urgency.       Urinary discoloration (darker yellow)  Skin: Negative for rash and wound.  Neurological: Negative for dizziness, weakness and headaches.       Objective:   Physical Exam  Constitutional: She is oriented to person, place,  and time. She appears well-developed and well-nourished. No distress.  HENT:  Head: Normocephalic and atraumatic.  Eyes: Pupils are equal, round, and reactive to light. EOM are normal.  Neck: Neck supple.  Cardiovascular: Normal rate.  Pulmonary/Chest: Effort normal. No respiratory distress.  Abdominal: There is no CVA tenderness.  Musculoskeletal: Normal range of motion.  Neurological: She is alert and oriented to person, place, and time.  Skin: Skin is warm and dry.  Psychiatric: She has a normal mood and affect. Her behavior is normal.  Nursing note and vitals reviewed.   Vitals:   07/27/17 1748  BP: 110/68  Pulse: 72  Resp: 16  Temp: 98 F (36.7 C)  TempSrc: Oral  SpO2: 99%  Weight: 113 lb (51.3 kg)  Height: 5' 1.42" (1.56 m)   Results for orders placed or performed in visit on 07/27/17  POCT urinalysis dipstick  Result Value Ref Range   Color, UA yellow yellow   Clarity, UA clear clear   Glucose, UA negative negative mg/dL   Bilirubin, UA negative negative   Ketones, POC UA negative negative mg/dL   Spec Grav, UA 1.020 1.010 - 1.025   [name], UA negative negative   pH, UA 8.0 5.0 - 8.0   Protein Ur, POC negative negative mg/dL   Urobilinogen, UA 1.0 0.2 or 1.0 E.U./dL   Nitrite, UA Negative Negative   Leukocytes, UA Negative Negative  POCT Microscopic Urinalysis ([facility])  Result Value Ref Range   WBC,UR,HPF,POC None None WBC/hpf   RBC,UR,HPF,POC None None RBC/hpf   Bacteria None None, Too numerous to count   Mucus Absent Absent   Epithelial Cells, UR Per Microscopy Few (A) None, Too numerous to count cells/hpf  POCT Wet + KOH Prep  Result Value Ref Range   Yeast by KOH Absent Absent   Yeast by wet prep Absent Absent   WBC by wet prep Few Few   Clue Cells Wet Prep HPF POC Moderate (A) None   Trich by wet prep Absent Absent   Bacteria Wet Prep HPF POC Many (A) Few   Epithelial Cells By [employer] Pref ([facility]) Few None, Few, Too numerous to count   RBC,UR,HPF,POC None  None RBC/hpf       Assessment & Plan:    Cheryl [name] is a 22 y.o. female Urinary frequency - Plan: POCT urinalysis dipstick, Urine Culture, POCT Microscopic Urinalysis ([facility])  Vaginal discharge - Plan: POCT Wet + KOH Prep, GC/Chlamydia Probe Amp  Routine screening for STI (sexually transmitted infection) - Plan: POCT Wet + KOH Prep, GC/Chlamydia Probe Amp  Bacterial vaginosis - Plan: metroNIDAZOLE (FLAGYL) 500 MG tablet  Urinalysis overall reassuring.  Chlamydia/gonorrhea  testing obtained, she declines HIV/syphilis testing presently.  Reassuring exam, self wet prep indicates likely bacterial vaginosis as cause of symptoms.  Start Flagyl, side effects discussed, and strict avoidance of alcohol discussed.  Handout given, RTC precautions  Meds ordered this encounter  Medications  . metroNIDAZOLE (FLAGYL) 500 MG tablet    Sig: 1 pill by mouth twice per day.  Avoid any alcohol while taking this medicine.    Dispense:  14 tablet    Refill:  0   Patient Instructions   Urine test appeared ok.  I will check sexual transmitted infection testing, but less likely cause.  You did have some signs of possible bacterial vaginosis.  I sent a prescription for metronidazole to your pharmacy.  See information below. Return to the clinic or go to the nearest emergency room if any of your symptoms worsen or new symptoms occur.   Bacterial Vaginosis Bacterial vaginosis is a vaginal infection that occurs when the normal balance of bacteria in the vagina is disrupted. It results from an overgrowth of certain bacteria. This is the most common vaginal infection among women ages 54-44. Because bacterial vaginosis increases your risk for STIs (sexually transmitted infections), getting treated can help reduce your risk for chlamydia, gonorrhea, herpes, and HIV (human immunodeficiency virus). Treatment is also important for preventing complications in pregnant women, because this condition can cause an early  (premature) delivery. What are the causes? This condition is caused by an increase in harmful bacteria that are normally present in small amounts in the vagina. However, the reason that the condition develops is not fully understood. What increases the risk? The following factors may make you more likely to develop this condition:  Having a new sexual partner or multiple sexual partners.  Having unprotected sex.  Douching.  Having an intrauterine device (IUD).  Smoking.  Drug and alcohol abuse.  Taking certain antibiotic medicines.  Being pregnant.  You cannot get bacterial vaginosis from toilet seats, bedding, swimming pools, or contact with objects around you. What are the signs or symptoms? Symptoms of this condition include:  Grey or white vaginal discharge. The discharge can also be watery or foamy.  A fish-like odor with discharge, especially after sexual intercourse or during menstruation.  Itching in and around the vagina.  Burning or pain with urination.  Some women with bacterial vaginosis have no signs or symptoms. How is this diagnosed? This condition is diagnosed based on:  Your medical history.  A physical exam of the vagina.  Testing a sample of vaginal fluid under a microscope to look for a large amount of bad bacteria or abnormal cells. Your health care provider may use a cotton swab or a small wooden spatula to collect the sample.  How is this treated? This condition is treated with antibiotics. These may be given as a pill, a vaginal cream, or a medicine that is put into the vagina (suppository). If the condition comes back after treatment, a second round of antibiotics may be needed. Follow these instructions at home: Medicines  Take over-the-counter and prescription medicines only as told by your health care provider.  Take or use your antibiotic as told by your health care provider. Do not stop taking or using the antibiotic even if you start  to feel better. General instructions  If you have a female sexual partner, tell her that you have a vaginal infection. She should see her health care provider and be treated if she has symptoms. If you have a  female sexual partner, he does not need treatment.  During treatment: ? Avoid sexual activity until you finish treatment. ? Do not douche. ? Avoid alcohol as directed by your health care provider. ? Avoid breastfeeding as directed by your health care provider.  Drink enough water and fluids to keep your urine clear or pale yellow.  Keep the area around your vagina and rectum clean. ? Wash the area daily with warm water. ? Wipe yourself from front to back after using the toilet.  Keep all follow-up visits as told by your health care provider. This is important. How is this prevented?  Do not douche.  Wash the outside of your vagina with warm water only.  Use protection when having sex. This includes latex condoms and dental dams.  Limit how many sexual partners you have. To help prevent bacterial vaginosis, it is best to have sex with just one partner (monogamous).  Make sure you and your sexual partner are tested for STIs.  Wear cotton or cotton-lined underwear.  Avoid wearing tight pants and pantyhose, especially during summer.  Limit the amount of alcohol that you drink.  Do not use any products that contain nicotine or tobacco, such as cigarettes and e-cigarettes. If you need help quitting, ask your health care provider.  Do not use illegal drugs. Where to find more information:  Centers for Disease Control and Prevention: AppraiserFraud.fi  American Sexual Health Association (ASHA): www.ashastd.org  U.S. Department of Health and Financial controller, Office on Women's Health: DustingSprays.pl or SecuritiesCard.it Contact a health care provider if:  Your symptoms do not improve, even after treatment.  You have more  discharge or pain when urinating.  You have a fever.  You have pain in your abdomen.  You have pain during sex.  You have vaginal bleeding between periods. Summary  Bacterial vaginosis is a vaginal infection that occurs when the normal balance of bacteria in the vagina is disrupted.  Because bacterial vaginosis increases your risk for STIs (sexually transmitted infections), getting treated can help reduce your risk for chlamydia, gonorrhea, herpes, and HIV (human immunodeficiency virus). Treatment is also important for preventing complications in pregnant women, because the condition can cause an early (premature) delivery.  This condition is treated with antibiotic medicines. These may be given as a pill, a vaginal cream, or a medicine that is put into the vagina (suppository). This information is not intended to replace advice given to you by your health care provider. Make sure you discuss any questions you have with your health care provider. Document Released: 03/27/2005 Document Revised: 07/31/2016 Document Reviewed: 12/11/2015 Elsevier Interactive Patient Education  2018 [employer].     IF you received an x-ray today, you will receive an invoice from [facility]. Please contact [facility] at [phone] with questions or concerns regarding your invoice.   IF you received labwork today, you will receive an invoice from [place]. Please contact [facility] at [phone] with questions or concerns regarding your invoice.   Our billing staff will not be able to assist you with questions regarding bills from these companies.  You will be contacted with the lab results as soon as they are available. The fastest way to get your results is to activate your My Chart account. Instructions are located on the last page of this paperwork. If you have not heard from [place] regarding the results in 2 weeks, please contact this office.       I personally performed the  services described in this  documentation, which was scribed in my presence. The recorded information has been reviewed and considered for accuracy and completeness, addended by me as needed, and agree with information above.  Signed,   [name], MD [facility].  07/28/17 10:44 PM

## 2017-07-27 NOTE — Patient Instructions (Addendum)
Urine test appeared ok.  I will check sexual transmitted infection testing, but less likely cause.  You did have some signs of possible bacterial vaginosis.  I sent a prescription for metronidazole to your pharmacy.  See information below. Return to the clinic or go to the nearest emergency room if any of your symptoms worsen or new symptoms occur.   Bacterial Vaginosis Bacterial vaginosis is a vaginal infection that occurs when the normal balance of bacteria in the vagina is disrupted. It results from an overgrowth of certain bacteria. This is the most common vaginal infection among women ages 30-44. Because bacterial vaginosis increases your risk for STIs (sexually transmitted infections), getting treated can help reduce your risk for chlamydia, gonorrhea, herpes, and HIV (human immunodeficiency virus). Treatment is also important for preventing complications in pregnant women, because this condition can cause an early (premature) delivery. What are the causes? This condition is caused by an increase in harmful bacteria that are normally present in small amounts in the vagina. However, the reason that the condition develops is not fully understood. What increases the risk? The following factors may make you more likely to develop this condition:  Having a new sexual partner or multiple sexual partners.  Having unprotected sex.  Douching.  Having an intrauterine device (IUD).  Smoking.  Drug and alcohol abuse.  Taking certain antibiotic medicines.  Being pregnant.  You cannot get bacterial vaginosis from toilet seats, bedding, swimming pools, or contact with objects around you. What are the signs or symptoms? Symptoms of this condition include:  Grey or white vaginal discharge. The discharge can also be watery or foamy.  A fish-like odor with discharge, especially after sexual intercourse or during menstruation.  Itching in and around the vagina.  Burning or pain with  urination.  Some women with bacterial vaginosis have no signs or symptoms. How is this diagnosed? This condition is diagnosed based on:  Your medical history.  A physical exam of the vagina.  Testing a sample of vaginal fluid under a microscope to look for a large amount of bad bacteria or abnormal cells. Your health care provider may use a cotton swab or a small wooden spatula to collect the sample.  How is this treated? This condition is treated with antibiotics. These may be given as a pill, a vaginal cream, or a medicine that is put into the vagina (suppository). If the condition comes back after treatment, a second round of antibiotics may be needed. Follow these instructions at home: Medicines  Take over-the-counter and prescription medicines only as told by your health care provider.  Take or use your antibiotic as told by your health care provider. Do not stop taking or using the antibiotic even if you start to feel better. General instructions  If you have a female sexual partner, tell her that you have a vaginal infection. She should see her health care provider and be treated if she has symptoms. If you have a female sexual partner, he does not need treatment.  During treatment: ? Avoid sexual activity until you finish treatment. ? Do not douche. ? Avoid alcohol as directed by your health care provider. ? Avoid breastfeeding as directed by your health care provider.  Drink enough water and fluids to keep your urine clear or pale yellow.  Keep the area around your vagina and rectum clean. ? Wash the area daily with warm water. ? Wipe yourself from front to back after using the toilet.  Keep all follow-up visits  as told by your health care provider. This is important. How is this prevented?  Do not douche.  Wash the outside of your vagina with warm water only.  Use protection when having sex. This includes latex condoms and dental dams.  Limit how many sexual  partners you have. To help prevent bacterial vaginosis, it is best to have sex with just one partner (monogamous).  Make sure you and your sexual partner are tested for STIs.  Wear cotton or cotton-lined underwear.  Avoid wearing tight pants and pantyhose, especially during summer.  Limit the amount of alcohol that you drink.  Do not use any products that contain nicotine or tobacco, such as cigarettes and e-cigarettes. If you need help quitting, ask your health care provider.  Do not use illegal drugs. Where to find more information:  Centers for Disease Control and Prevention: AppraiserFraud.fi  American Sexual Health Association (ASHA): www.ashastd.org  U.S. Department of Health and Financial controller, Office on Women's Health: DustingSprays.pl or SecuritiesCard.it Contact a health care provider if:  Your symptoms do not improve, even after treatment.  You have more discharge or pain when urinating.  You have a fever.  You have pain in your abdomen.  You have pain during sex.  You have vaginal bleeding between periods. Summary  Bacterial vaginosis is a vaginal infection that occurs when the normal balance of bacteria in the vagina is disrupted.  Because bacterial vaginosis increases your risk for STIs (sexually transmitted infections), getting treated can help reduce your risk for chlamydia, gonorrhea, herpes, and HIV (human immunodeficiency virus). Treatment is also important for preventing complications in pregnant women, because the condition can cause an early (premature) delivery.  This condition is treated with antibiotic medicines. These may be given as a pill, a vaginal cream, or a medicine that is put into the vagina (suppository). This information is not intended to replace advice given to you by your health care provider. Make sure you discuss any questions you have with your health care provider. Document Released:  03/27/2005 Document Revised: 07/31/2016 Document Reviewed: 12/11/2015 Elsevier Interactive Patient Education  2018 [employer].     IF you received an x-ray today, you will receive an invoice from [facility]. Please contact [facility] at [phone] with questions or concerns regarding your invoice.   IF you received labwork today, you will receive an invoice from [place]. Please contact [facility] at [phone] with questions or concerns regarding your invoice.   Our billing staff will not be able to assist you with questions regarding bills from these companies.  You will be contacted with the lab results as soon as they are available. The fastest way to get your results is to activate your My Chart account. Instructions are located on the last page of this paperwork. If you have not heard from [place] regarding the results in 2 weeks, please contact this office.

## 2017-07-29 LAB — URINE CULTURE

## 2017-07-31 NOTE — Telephone Encounter (Signed)
Cheryl [name] has not called back. I will mail a letter to her. TG

## 2017-08-01 LAB — GC/CHLAMYDIA PROBE AMP
Chlamydia trachomatis, NAA: UNDETERMINED
Neisseria gonorrhoeae by PCR: NEGATIVE

## 2017-08-10 ENCOUNTER — Other Ambulatory Visit: Payer: Self-pay | Admitting: Family Medicine

## 2017-08-10 DIAGNOSIS — A749 Chlamydial infection, unspecified: Secondary | ICD-10-CM

## 2017-08-10 MED ORDER — AZITHROMYCIN 250 MG PO TABS: 1000.0000 mg | ORAL_TABLET | Freq: Once | ORAL | 0 refills | Status: AC

## 2017-10-31 ENCOUNTER — Telehealth (INDEPENDENT_AMBULATORY_CARE_PROVIDER_SITE_OTHER): Payer: Self-pay | Admitting: Family

## 2017-10-31 NOTE — Telephone Encounter (Signed)
I called and spoke with Cheryl [name]. She said that she did not sleep well last night and then slept late this morning. When she awakened, she took Levetiracetam but it was 2 hours after her using dose. She walked to next room and felt that she was going to have a seizure. She had a 1 minute seizure witnessed by her fiance and then before she recovered had another one. After she was responsive, her fiance gave her 2 more Levetiracetam tablets. I talked with Cheryl [name] and told her that sleep deprivation was a known trigger for seizures. I instructed her to work on getting better sleep and staying on a routine sleep schedule. I instructed her to rest today and to take her usual dose of Levetiracetam tonight. I asked [name] to let me know if she has any more seizures. She agreed with this plan. TG

## 2017-10-31 NOTE — Telephone Encounter (Signed)
°  Who's calling (name and relationship to patient) : Mandy (Patient) Best contact number: [phone] Provider they see: Otila Kluver  Reason for call: Pt stated she had two consecutive, inverted seizures this morning. She took recommended dose of Keppra before the seizures and took another dose following the seizures. She would like to know if she should skip the night dose or take it. Please advise.

## 2017-12-15 ENCOUNTER — Other Ambulatory Visit (INDEPENDENT_AMBULATORY_CARE_PROVIDER_SITE_OTHER): Payer: Self-pay | Admitting: Family

## 2017-12-15 DIAGNOSIS — G40309 Generalized idiopathic epilepsy and epileptic syndromes, not intractable, without status epilepticus: Secondary | ICD-10-CM

## 2017-12-15 DIAGNOSIS — G40209 Localization-related (focal) (partial) symptomatic epilepsy and epileptic syndromes with complex partial seizures, not intractable, without status epilepticus: Secondary | ICD-10-CM

## 2018-01-17 ENCOUNTER — Other Ambulatory Visit (INDEPENDENT_AMBULATORY_CARE_PROVIDER_SITE_OTHER): Payer: Self-pay | Admitting: Family

## 2018-01-17 DIAGNOSIS — G40309 Generalized idiopathic epilepsy and epileptic syndromes, not intractable, without status epilepticus: Secondary | ICD-10-CM

## 2018-01-17 DIAGNOSIS — G40209 Localization-related (focal) (partial) symptomatic epilepsy and epileptic syndromes with complex partial seizures, not intractable, without status epilepticus: Secondary | ICD-10-CM

## 2018-02-15 ENCOUNTER — Other Ambulatory Visit (INDEPENDENT_AMBULATORY_CARE_PROVIDER_SITE_OTHER): Payer: Self-pay | Admitting: Family

## 2018-02-15 DIAGNOSIS — G40309 Generalized idiopathic epilepsy and epileptic syndromes, not intractable, without status epilepticus: Secondary | ICD-10-CM

## 2018-02-15 DIAGNOSIS — G40209 Localization-related (focal) (partial) symptomatic epilepsy and epileptic syndromes with complex partial seizures, not intractable, without status epilepticus: Secondary | ICD-10-CM

## 2018-03-17 ENCOUNTER — Other Ambulatory Visit (INDEPENDENT_AMBULATORY_CARE_PROVIDER_SITE_OTHER): Payer: Self-pay | Admitting: Family

## 2018-03-17 DIAGNOSIS — G40309 Generalized idiopathic epilepsy and epileptic syndromes, not intractable, without status epilepticus: Secondary | ICD-10-CM

## 2018-03-17 DIAGNOSIS — G40209 Localization-related (focal) (partial) symptomatic epilepsy and epileptic syndromes with complex partial seizures, not intractable, without status epilepticus: Secondary | ICD-10-CM

## 2018-03-25 ENCOUNTER — Ambulatory Visit: Payer: BLUE CROSS/BLUE SHIELD | Admitting: Family Medicine

## 2018-04-06 DIAGNOSIS — R1032 Left lower quadrant pain: Secondary | ICD-10-CM | POA: Diagnosis not present

## 2018-04-06 DIAGNOSIS — R1904 Left lower quadrant abdominal swelling, mass and lump: Secondary | ICD-10-CM | POA: Diagnosis not present

## 2018-04-06 DIAGNOSIS — D649 Anemia, unspecified: Secondary | ICD-10-CM | POA: Diagnosis not present

## 2018-04-06 DIAGNOSIS — D259 Leiomyoma of uterus, unspecified: Secondary | ICD-10-CM | POA: Diagnosis not present

## 2018-04-06 DIAGNOSIS — R1031 Right lower quadrant pain: Secondary | ICD-10-CM | POA: Diagnosis not present

## 2018-04-09 ENCOUNTER — Telehealth (INDEPENDENT_AMBULATORY_CARE_PROVIDER_SITE_OTHER): Payer: Self-pay | Admitting: Family

## 2018-04-09 DIAGNOSIS — G40309 Generalized idiopathic epilepsy and epileptic syndromes, not intractable, without status epilepticus: Secondary | ICD-10-CM

## 2018-04-09 DIAGNOSIS — G40209 Localization-related (focal) (partial) symptomatic epilepsy and epileptic syndromes with complex partial seizures, not intractable, without status epilepticus: Secondary | ICD-10-CM

## 2018-04-09 NOTE — Telephone Encounter (Signed)
°  Who's calling (name and relationship to patient) : Cheryl [name] (patient) Best contact number: [phone] Provider they see: Cloretta Ned  Reason for call: Patient called stating pharmacy need new Rx for medication sent to pharmacy     PRESCRIPTION REFILL ONLY  Name of prescription: Levetiracetam (Keppra)   Pharmacy: [facility] [place]

## 2018-04-11 MED ORDER — LEVETIRACETAM 250 MG PO TABS: ORAL_TABLET | ORAL | 2 refills | Status: DC

## 2018-04-11 NOTE — Telephone Encounter (Signed)
Rx has been electronically sent to the pharmacy 

## 2018-04-18 ENCOUNTER — Other Ambulatory Visit (INDEPENDENT_AMBULATORY_CARE_PROVIDER_SITE_OTHER): Payer: Self-pay | Admitting: Family

## 2018-04-18 DIAGNOSIS — G40209 Localization-related (focal) (partial) symptomatic epilepsy and epileptic syndromes with complex partial seizures, not intractable, without status epilepticus: Secondary | ICD-10-CM

## 2018-04-18 DIAGNOSIS — G40309 Generalized idiopathic epilepsy and epileptic syndromes, not intractable, without status epilepticus: Secondary | ICD-10-CM

## 2018-05-02 ENCOUNTER — Telehealth (INDEPENDENT_AMBULATORY_CARE_PROVIDER_SITE_OTHER): Payer: Self-pay | Admitting: Family

## 2018-05-02 NOTE — Telephone Encounter (Signed)
°  Who's calling (name and relationship to patient) : Patient Best contact number: [phone] Provider they see: [place] Reason for call: Cheryl [name] will be traveling with fiance that is going in the [employer].  Before they leave she would to know how that works as far as her getting her [place] filled .Please call.    PRESCRIPTION REFILL ONLY  Name of prescription:  Pharmacy:

## 2018-05-06 NOTE — Telephone Encounter (Signed)
I called and left a message for Cheryl [name], asking her to call back. She needs a follow up appointment before she leaves town so that I can continue to refill her medication. TG

## 2018-06-20 ENCOUNTER — Ambulatory Visit (HOSPITAL_COMMUNITY)
Admission: EM | Admit: 2018-06-20 | Discharge: 2018-06-20 | Disposition: A | Discharge status: Home / Self Care | Payer: Commercial Managed Care - PPO | Attending: Family Medicine | Admitting: Family Medicine

## 2018-06-20 ENCOUNTER — Encounter (HOSPITAL_COMMUNITY): Payer: Self-pay

## 2018-06-20 ENCOUNTER — Ambulatory Visit (INDEPENDENT_AMBULATORY_CARE_PROVIDER_SITE_OTHER): Payer: Commercial Managed Care - PPO | Admitting: Family Medicine

## 2018-06-20 ENCOUNTER — Other Ambulatory Visit: Payer: Self-pay

## 2018-06-20 DIAGNOSIS — N3001 Acute cystitis with hematuria: Secondary | ICD-10-CM | POA: Diagnosis not present

## 2018-06-20 DIAGNOSIS — Z111 Encounter for screening for respiratory tuberculosis: Secondary | ICD-10-CM

## 2018-06-20 LAB — POCT URINALYSIS DIP (DEVICE)
Glucose, UA: 250 mg/dL — AB
Ketones, ur: 15 mg/dL — AB
Nitrite: POSITIVE — AB
Protein, ur: >=300 mg/dL — AB
Specific Gravity, Urine: 1.01 (ref 1.005–1.030)
Urobilinogen, UA: >=8 mg/dL (ref 0.0–1.0)
pH: 5 (ref 5.0–8.0)

## 2018-06-20 MED ORDER — CEPHALEXIN 500 MG PO CAPS: 500.0000 mg | ORAL_CAPSULE | Freq: Four times a day (QID) | ORAL | 0 refills | Status: DC

## 2018-06-20 NOTE — Patient Instructions (Signed)

## 2018-06-20 NOTE — ED Provider Notes (Signed)
[name]    CSN: [id number] Arrival date & time: 06/20/18  1507     History   Chief Complaint Chief Complaint  Patient presents with  . Urinary Tract Infection    HPI Cheryl [name] is a 23 y.o. female.   Pt presents with urinary tract symptoms; urinary frequency, urgency, burning with urination, and blood in urine since yesterday day. Patient states she may be about to start her menstrual cycle.  Patient is gay.     Past Medical History:  Diagnosis Date  . Seizures [facility])     Patient Active Problem List   Diagnosis Date Noted  . Dysuria 08/29/2016  . Partial epilepsy with impairment of consciousness ([place]) 11/22/2012  . Generalized convulsive epilepsy ([place]) 11/22/2012    Past Surgical History:  Procedure Laterality Date  . NO PAST SURGERIES      OB History   No obstetric history on file.      Home Medications    Prior to Admission medications   Medication Sig Start Date End Date Taking? Authorizing Provider  cephALEXin (KEFLEX) 500 MG capsule Take 1 capsule (500 mg total) by mouth 4 (four) times daily. 06/20/18   Robyn Haber, MD  levETIRAcetam (KEPPRA) 250 MG tablet TAKE 2 TABLETS BY MOUTH EVERY MORNING AND 3 TABLETS EVERY NIGHT AT BEDTIME 04/18/18   Rockwell Germany, NP  metroNIDAZOLE (FLAGYL) 500 MG tablet 1 pill by mouth twice per day.  Avoid any alcohol while taking this medicine. 07/27/17   Wendie Agreste, MD    Family History History reviewed. No pertinent family history.  Social History Social History   Tobacco Use  . Smoking status: Never Smoker  . Smokeless tobacco: Never Used  Substance Use Topics  . Alcohol use: Yes    Comment: One or twice a month-Socially  . Drug use: No     Allergies   Patient has no known allergies.   Review of Systems Review of Systems  Genitourinary: Positive for difficulty urinating, dyspareunia, frequency and hematuria.     Physical Exam Triage Vital Signs ED Triage Vitals  Enc  Vitals Group     BP 06/20/18 1526 (!) 123/59     Pulse Rate 06/20/18 1526 99     Resp 06/20/18 1526 20     Temp 06/20/18 1526 98.3 F (36.8 C)     Temp Source 06/20/18 1526 Oral     SpO2 06/20/18 1526 100 %     Weight --      Height --      Head Circumference --      Peak Flow --      Pain Score 06/20/18 1529 6     Pain Loc --      Pain Edu? --      Excl. in [place]? --    No data found.  Updated Vital Signs BP (!) 123/59 (BP Location: Right Arm)   Pulse 99   Temp 98.3 F (36.8 C) (Oral)   Resp 20   LMP 05/27/2018   SpO2 100%    Physical Exam Vitals signs and nursing note reviewed.  Constitutional:      Appearance: Normal appearance.  HENT:     Head: Normocephalic.     Mouth/Throat:     Mouth: Mucous membranes are moist.  Neck:     Musculoskeletal: Normal range of motion.  Cardiovascular:     Rate and Rhythm: Normal rate and regular rhythm.  Abdominal:     Tenderness: There is  abdominal tenderness in the right lower quadrant and left lower quadrant.     Comments: Reports history of fibroids and that abd tenderness is normal for her.    Neurological:     Mental Status: She is alert.      UC Treatments / Results  Labs (all labs ordered are listed, but only abnormal results are displayed) Labs Reviewed  POCT URINALYSIS DIP (DEVICE) - Abnormal; Notable for the following components:      Result Value   Glucose, UA 250 (*)    Bilirubin Urine MODERATE (*)    Ketones, ur 15 (*)    Hgb urine dipstick MODERATE (*)    Protein, ur >=300 (*)    Nitrite POSITIVE (*)    Leukocytes,Ua LARGE (*)    All other components within normal limits  URINE CULTURE    EKG None  Radiology No results found.  Procedures Procedures (including critical care time)  Medications Ordered in UC Medications - No data to display  Initial Impression / Assessment and Plan / UC Course  I have reviewed the triage vital signs and the nursing notes.  Pertinent labs & imaging results  that were available during my care of the patient were reviewed by me and considered in my medical decision making (see chart for details).    Final Clinical Impressions(s) / UC Diagnoses   Final diagnoses:  Acute cystitis with hematuria   Discharge Instructions   None    ED Prescriptions    Medication Sig Dispense Auth. Provider   cephALEXin (KEFLEX) 500 MG capsule Take 1 capsule (500 mg total) by mouth 4 (four) times daily. 20 capsule Robyn Haber, MD     Controlled Substance Prescriptions [place] Controlled Substance Registry consulted? Not Applicable   Robyn Haber, MD 06/20/18 [phone]

## 2018-06-20 NOTE — ED Triage Notes (Signed)
Pt presents with urinary tract symptoms; urinary frequency, urgency, burning with urination, and blood in urine.

## 2018-06-20 NOTE — ED Notes (Signed)
Patient is taking azo, urinalysis results will vary.

## 2018-06-22 LAB — URINE CULTURE: Culture: >=100000 — AB

## 2018-06-24 ENCOUNTER — Telehealth (HOSPITAL_COMMUNITY): Payer: Self-pay | Admitting: Emergency Medicine

## 2018-06-24 NOTE — Telephone Encounter (Signed)
Attempted to reach patient. No answer at this time.   

## 2018-07-22 ENCOUNTER — Telehealth (INDEPENDENT_AMBULATORY_CARE_PROVIDER_SITE_OTHER): Payer: Self-pay | Admitting: Family

## 2018-07-22 DIAGNOSIS — G40209 Localization-related (focal) (partial) symptomatic epilepsy and epileptic syndromes with complex partial seizures, not intractable, without status epilepticus: Secondary | ICD-10-CM

## 2018-07-22 DIAGNOSIS — G40309 Generalized idiopathic epilepsy and epileptic syndromes, not intractable, without status epilepticus: Secondary | ICD-10-CM

## 2018-07-22 MED ORDER — LEVETIRACETAM 250 MG PO TABS: ORAL_TABLET | ORAL | 2 refills | Status: DC

## 2018-07-23 NOTE — Telephone Encounter (Signed)
RX has been sent to the pharmacy.

## 2018-07-29 ENCOUNTER — Other Ambulatory Visit: Payer: Self-pay

## 2018-07-30 ENCOUNTER — Telehealth: Payer: Self-pay | Admitting: *Deleted

## 2018-07-30 ENCOUNTER — Ambulatory Visit: Payer: Commercial Managed Care - PPO | Admitting: Obstetrics & Gynecology

## 2018-07-30 ENCOUNTER — Encounter: Payer: Self-pay | Admitting: Obstetrics & Gynecology

## 2018-07-30 VITALS — BP 104/68 | Ht 63.0 in | Wt 108.0 lb

## 2018-07-30 DIAGNOSIS — N6312 Unspecified lump in the right breast, upper inner quadrant: Secondary | ICD-10-CM

## 2018-07-30 DIAGNOSIS — D219 Benign neoplasm of connective and other soft tissue, unspecified: Secondary | ICD-10-CM

## 2018-07-30 DIAGNOSIS — N920 Excessive and frequent menstruation with regular cycle: Secondary | ICD-10-CM

## 2018-07-30 DIAGNOSIS — R87610 Atypical squamous cells of undetermined significance on cytologic smear of cervix (ASC-US): Secondary | ICD-10-CM | POA: Diagnosis not present

## 2018-07-30 DIAGNOSIS — Z01419 Encounter for gynecological examination (general) (routine) without abnormal findings: Secondary | ICD-10-CM

## 2018-07-30 LAB — CBC
HCT: 26.3 % — ABNORMAL LOW (ref 35.0–45.0)
Hemoglobin: 7.5 g/dL — ABNORMAL LOW (ref 11.7–15.5)
MCH: 19.4 pg — ABNORMAL LOW (ref 27.0–33.0)
MCHC: 28.5 g/dL — ABNORMAL LOW (ref 32.0–36.0)
MCV: 68.1 fL — ABNORMAL LOW (ref 80.0–100.0)
MPV: 11.1 fL (ref 7.5–12.5)
Platelets: 421 10*3/uL — ABNORMAL HIGH (ref 140–400)
RBC: 3.86 10*6/uL (ref 3.80–5.10)
RDW: 17.6 % — ABNORMAL HIGH (ref 11.0–15.0)
WBC: 3 10*3/uL — ABNORMAL LOW (ref 3.8–10.8)

## 2018-07-30 NOTE — Patient Instructions (Signed)
1. Encounter for routine gynecological examination with Papanicolaou smear of cervix Gynecologic exam with an enlarged fibroid uterus.  Pap reflex done today.  Breast exam with the right breast nodule.  Good body mass index at 19.13.  Patient is fit and has a healthy nutrition.  2. Menorrhagia with regular cycle Longstanding menorrhagia with regular cycles.  Known uterine fibroids per pelvic ultrasound December 2019 in [place], which we will obtain and by gynecologic exam today.  Will rule out anemia with a CBC today and patient will follow-up with a pelvic ultrasound to document the fibroids number, size, location.  At that visit we will decide per findings on whether surgical removal is indicated and when as well as make the decision on whether controlling the cycle with a progestin only birth control pill is indicated.  Patient is considering future conception as high likelihood.  Patient is in a long-term same-sex relationship, she is engaged. - CBC - US Transvaginal Non-OB; Future  3. Fibroids Nodular uterus, with fibroids, measuring about 14 cm.  Rule out anemia with a CBC today and follow-up with a pelvic ultrasound as written above. - CBC - US Transvaginal Non-OB; Future  4. Breast lump on right side at 1 o'clock position Right breast nodule at 1:00 measuring 1.5 x 1 cm.  Appears benign on exam, but will complete investigation with a right diagnostic mammogram and ultrasound.  [name], it was a pleasure seeing you today!  I will inform you of your results as soon as they are available.

## 2018-07-30 NOTE — Telephone Encounter (Signed)
-----   Message from Princess Bruins, MD sent at 07/30/2018 10:55 AM EDT ----- Regarding: Schedule Rt Dx mammo/US Rt breast mobile nodule at 1 O'Clock 1.0 x 1.5 cm.

## 2018-07-30 NOTE — Progress Notes (Signed)
Cheryl [name] April 22, 1995 [id number]   History:    23 y.o. G0 Engaged, same sex partner  RP:  New patient presenting for annual gyn exam   HPI:  Long standing heavy menstrual flow monthly.  No breakthrough bleeding.  Feeling of pelvic pressure since October 2019.  Had a pelvic US 03/2018 in [place] which showed uterine fibroids per patient.  Same-sex relationship.  Urine and bowel movements normal, although patient has a tendency towards constipation.  Breasts normal, although after finding a nodule on the right breast on exam, patient mentions that she has been feeling that prolonged for many years and reports that it gets slightly larger and more tender in the premenstrual period, no investigation so far.  Body mass index 19.13.  Patient is fit and has a healthy nutrition.  Past medical history,surgical history, family history and social history were all reviewed and documented in the EPIC chart.  Gynecologic History Patient's last menstrual period was 07/09/2018. Contraception: Same sex partner Last Pap: 05/2017. Results were: Negative Last mammogram: Never Bone Density: Never Colonoscopy: Never  Obstetric History OB History  Gravida Para Term Preterm AB Living  0 0 0 0 0 0  SAB TAB Ectopic Multiple Live Births  0 0 0 0 0     ROS: A ROS was performed and pertinent positives and negatives are included in the history.  GENERAL: No fevers or chills. HEENT: No change in vision, no earache, sore throat or sinus congestion. NECK: No pain or stiffness. CARDIOVASCULAR: No chest pain or pressure. No palpitations. PULMONARY: No shortness of breath, cough or wheeze. GASTROINTESTINAL: No abdominal pain, nausea, vomiting or diarrhea, melena or bright red blood per rectum. GENITOURINARY: No urinary frequency, urgency, hesitancy or dysuria. MUSCULOSKELETAL: No joint or muscle pain, no back pain, no recent trauma. DERMATOLOGIC: No rash, no itching, no lesions. ENDOCRINE: No polyuria, polydipsia, no  heat or cold intolerance. No recent change in weight. HEMATOLOGICAL: No anemia or easy bruising or bleeding. NEUROLOGIC: No headache, seizures, numbness, tingling or weakness. PSYCHIATRIC: No depression, no loss of interest in normal activity or change in sleep pattern.     Exam:   BP 104/68   Ht 5\' 3"  (1.6 m)   Wt 108 lb (49 kg)   LMP 07/09/2018   BMI 19.13 kg/m   Body mass index is 19.13 kg/m.  General appearance : Well developed well nourished female. No acute distress HEENT: Eyes: no retinal hemorrhage or exudates,  Neck supple, trachea midline, no carotid bruits, no thyroidmegaly Lungs: Clear to auscultation, no rhonchi or wheezes, or rib retractions  Heart: Regular rate and rhythm, no murmurs or gallops Breast:Examined in sitting and supine position were symmetrical in appearance, left breast no palpable masses or tenderness,  no skin retraction, no nipple inversion, no nipple discharge, no skin discoloration, no axillary or supraclavicular lymphadenopathy.  Right breast with a nodule at 1 O'Clock 1.5 x 1.0 cm, mobile, NT, regular. Abdomen: no palpable masses or tenderness, no rebound or guarding Extremities: no edema or skin discoloration or tenderness  Pelvic: Vulva: Normal             Vagina: No gross lesions or discharge  Cervix: No gross lesions or discharge.  Pap reflex done.  Uterus  AV, increased in size about 14 cm, nodular, hard, non-tender and mobile  Adnexa  Without masses or tenderness  Anus: Normal   Assessment/Plan:  23 y.o. female for annual exam   1. Encounter for routine gynecological examination with Papanicolaou  smear of cervix Gynecologic exam with an enlarged fibroid uterus.  Pap reflex done today.  Breast exam with the right breast nodule.  Good body mass index at 19.13.  Patient is fit and has a healthy nutrition.  2. Menorrhagia with regular cycle Longstanding menorrhagia with regular cycles.  Known uterine fibroids per pelvic ultrasound December  2019 in [place], which we will obtain and by gynecologic exam today.  Will rule out anemia with a CBC today and patient will follow-up with a pelvic ultrasound to document the fibroids number, size, location.  At that visit we will decide per findings on whether surgical removal is indicated and when as well as make the decision on whether controlling the cycle with a progestin only birth control pill is indicated.  Patient is considering future conception as high likelihood.  Patient is in a long-term same-sex relationship, she is engaged. - CBC - US Transvaginal Non-OB; Future  3. Fibroids Nodular uterus, with fibroids, measuring about 14 cm.  Rule out anemia with a CBC today and follow-up with a pelvic ultrasound as written above. - CBC - US Transvaginal Non-OB; Future  4. Breast lump on right side at 1 o'clock position Right breast nodule at 1:00 measuring 1.5 x 1 cm.  Appears benign on exam, but will complete investigation with a right diagnostic mammogram and ultrasound.  Counseling on above issues and coordination of care more than 50% for 10 minutes.  Princess Bruins MD, 10:26 AM 07/30/2018

## 2018-07-30 NOTE — Telephone Encounter (Signed)
Patient scheduled on 08/06/18 at 10:15am at the breast center of [place]. Patient informed. Only breast imaging need for right ultrasound, patient must by age 23 to have dx mammo.

## 2018-08-01 LAB — PAP IG W/ RFLX HPV ASCU

## 2018-08-01 LAB — HUMAN PAPILLOMAVIRUS, HIGH RISK: HPV DNA High Risk: NOT DETECTED

## 2018-08-05 ENCOUNTER — Other Ambulatory Visit: Payer: Self-pay

## 2018-08-06 ENCOUNTER — Ambulatory Visit
Admission: RE | Admit: 2018-08-06 | Discharge: 2018-08-06 | Disposition: A | Discharge status: Home / Self Care | Payer: Commercial Managed Care - PPO | Source: Ambulatory Visit | Attending: Obstetrics & Gynecology | Admitting: Obstetrics & Gynecology

## 2018-08-06 ENCOUNTER — Other Ambulatory Visit: Payer: Self-pay | Admitting: Obstetrics & Gynecology

## 2018-08-06 DIAGNOSIS — N6310 Unspecified lump in the right breast, unspecified quadrant: Secondary | ICD-10-CM

## 2018-08-06 DIAGNOSIS — N6312 Unspecified lump in the right breast, upper inner quadrant: Secondary | ICD-10-CM

## 2018-08-08 ENCOUNTER — Other Ambulatory Visit: Payer: Commercial Managed Care - PPO

## 2018-08-08 ENCOUNTER — Ambulatory Visit: Payer: Commercial Managed Care - PPO | Admitting: Obstetrics & Gynecology

## 2018-08-20 ENCOUNTER — Other Ambulatory Visit: Payer: Self-pay

## 2018-08-21 ENCOUNTER — Ambulatory Visit: Payer: Commercial Managed Care - PPO | Admitting: Obstetrics & Gynecology

## 2018-08-21 ENCOUNTER — Other Ambulatory Visit: Payer: Commercial Managed Care - PPO

## 2018-08-26 ENCOUNTER — Other Ambulatory Visit: Payer: Self-pay

## 2018-08-27 ENCOUNTER — Other Ambulatory Visit: Payer: Commercial Managed Care - PPO

## 2018-08-27 ENCOUNTER — Ambulatory Visit: Payer: Commercial Managed Care - PPO | Admitting: Obstetrics & Gynecology

## 2018-08-29 ENCOUNTER — Other Ambulatory Visit: Payer: Self-pay | Admitting: Obstetrics & Gynecology

## 2018-08-29 ENCOUNTER — Ambulatory Visit (INDEPENDENT_AMBULATORY_CARE_PROVIDER_SITE_OTHER): Payer: Commercial Managed Care - PPO

## 2018-08-29 ENCOUNTER — Other Ambulatory Visit: Payer: Self-pay

## 2018-08-29 ENCOUNTER — Ambulatory Visit: Payer: Commercial Managed Care - PPO | Admitting: Obstetrics & Gynecology

## 2018-08-29 ENCOUNTER — Encounter: Payer: Self-pay | Admitting: Obstetrics & Gynecology

## 2018-08-29 DIAGNOSIS — N920 Excessive and frequent menstruation with regular cycle: Secondary | ICD-10-CM | POA: Diagnosis not present

## 2018-08-29 DIAGNOSIS — D219 Benign neoplasm of connective and other soft tissue, unspecified: Secondary | ICD-10-CM

## 2018-08-29 DIAGNOSIS — D649 Anemia, unspecified: Secondary | ICD-10-CM

## 2018-08-29 MED ORDER — NORETHINDRONE 0.35 MG PO TABS: 1.0000 | ORAL_TABLET | Freq: Every day | ORAL | 4 refills | Status: DC

## 2018-08-29 NOTE — Progress Notes (Signed)
    Cheryl [name] 03-23-1996 [id number]        22 y.o.  G0P0000 Single.  Engaged, same sex relationship  RP: Large fibroid uterus with heavy menses for Pelvic US  HPI: Heavy period again since last visit.  Hb 7.5 on 07/30/2018.  Pelvic pressure/discomfort.  Same sex relationship.  Desires preservation of fertility, would like to have children eventually.   OB History  Gravida Para Term Preterm AB Living  0 0 0 0 0 0  SAB TAB Ectopic Multiple Live Births  0 0 0 0 0    Past medical history,surgical history, problem list, medications, allergies, family history and social history were all reviewed and documented in the EPIC chart.   Directed ROS with pertinent positives and negatives documented in the history of present illness/assessment and plan.  Exam:  There were no vitals filed for this visit. General appearance:  Normal  Pelvic US today: T/A images.  Grossly enlarged uterus with multiple fibroids.  The uterus measures 13.76 x 10.81 x 7.55 cm.  The largest fibroid is 9.4 x 6.3 cm, then 4.1 x 3.8 cm, 3.8 x 3.6 cm, and 7 other smaller fibroids.  One fibroid appears submucosal, distorting the endometrial cavity.  The endometrial lining measures 8.1 mm.  Ovaries are very lateral in position and appear normal.  No adnexal masses seen.  No free fluid in the posterior cul-de-sac.  Hb 07/30/18:  7.5   Assessment/Plan:  23 y.o. G0  1.  Fibroids Large uterine fibroids the largest of which measures 9.4 x 6.3 cm.  One fibroid appears to be submucosal.  The endometrial lining is displaced but otherwise normal measured at 8.1 mm.  Normal ovaries and no adnexal mass seen.  Given the severity of patient's menorrhagia and secondary anemia, with a hemoglobin at 7.5, decision to start on the progestin only pill and follow-up with a sonohysterogram.  Different approaches reviewed with patient to control her bleeding and manage the fibroids including hormonal management and surgical options.  Will evaluate  by sonohysterogram to consider hysteroscopy with excision of any submucosal fibroid.  Robotic myomectomies also discussed. - [place] Sonohysterogram; Future  2. Menorrhagia with regular cycle Started on the progestin only pill to control the heavy bleeding.  Usage reviewed and prescription sent to pharmacy.  Patient recommended iron supplements as well as an iron rich nutrition.  Will recheck a hemoglobin at follow-up. - CBC; Future - [place] Sonohysterogram; Future  3. Secondary anemia FeSO4 325 mg 3 times a day recommended.  Iron rich nutrition discussed. - [place] Sonohysterogram; Future - CBC; Future  Other orders - norethindrone (MICRONOR) 0.35 MG tablet; Take 1 tablet (0.35 mg total) by mouth daily.  Counseling on above issues and coordination of care more than 50% for 25 min.  Princess Bruins MD, 11:50 AM 08/29/2018

## 2018-09-02 ENCOUNTER — Encounter: Payer: Self-pay | Admitting: Obstetrics & Gynecology

## 2018-09-02 NOTE — Patient Instructions (Signed)
1.  Fibroids Large uterine fibroids the largest of which measures 9.4 x 6.3 cm.  One fibroid appears to be submucosal.  The endometrial lining is displaced but otherwise normal measured at 8.1 mm.  Normal ovaries and no adnexal mass seen.  Given the severity of patient's menorrhagia and secondary anemia, with a hemoglobin at 7.5, decision to start on the progestin only pill and follow-up with a sonohysterogram.  Different approaches reviewed with patient to control her bleeding and manage the fibroids including hormonal management and surgical options.  Will evaluate by sonohysterogram to consider hysteroscopy with excision of any submucosal fibroid.  Robotic myomectomies also discussed. - [place] Sonohysterogram; Future  2. Menorrhagia with regular cycle Started on the progestin only pill to control the heavy bleeding.  Usage reviewed and prescription sent to pharmacy.  Patient recommended iron supplements as well as an iron rich nutrition.  Will recheck a hemoglobin at follow-up. - CBC; Future - [place] Sonohysterogram; Future  3. Secondary anemia FeSO4 325 mg 3 times a day recommended.  Iron rich nutrition discussed. - [place] Sonohysterogram; Future - CBC; Future  Other orders - norethindrone (MICRONOR) 0.35 MG tablet; Take 1 tablet (0.35 mg total) by mouth daily.  Cheryl [name], it was a pleasure seeing you today!

## 2018-09-12 ENCOUNTER — Ambulatory Visit: Payer: Commercial Managed Care - PPO | Admitting: Obstetrics & Gynecology

## 2018-09-12 ENCOUNTER — Other Ambulatory Visit: Payer: Commercial Managed Care - PPO

## 2018-09-23 ENCOUNTER — Other Ambulatory Visit: Payer: Self-pay

## 2018-09-24 ENCOUNTER — Encounter: Payer: Self-pay | Admitting: Obstetrics & Gynecology

## 2018-09-24 ENCOUNTER — Ambulatory Visit (INDEPENDENT_AMBULATORY_CARE_PROVIDER_SITE_OTHER): Payer: Commercial Managed Care - PPO

## 2018-09-24 ENCOUNTER — Ambulatory Visit: Payer: Commercial Managed Care - PPO | Admitting: Obstetrics & Gynecology

## 2018-09-24 DIAGNOSIS — D219 Benign neoplasm of connective and other soft tissue, unspecified: Secondary | ICD-10-CM | POA: Diagnosis not present

## 2018-09-24 DIAGNOSIS — N840 Polyp of corpus uteri: Secondary | ICD-10-CM

## 2018-09-24 DIAGNOSIS — N920 Excessive and frequent menstruation with regular cycle: Secondary | ICD-10-CM

## 2018-09-24 DIAGNOSIS — D649 Anemia, unspecified: Secondary | ICD-10-CM | POA: Diagnosis not present

## 2018-09-24 LAB — CBC
HCT: 30.6 % — ABNORMAL LOW (ref 35.0–45.0)
Hemoglobin: 8.7 g/dL — ABNORMAL LOW (ref 11.7–15.5)
MCH: 21.4 pg — ABNORMAL LOW (ref 27.0–33.0)
MCHC: 28.4 g/dL — ABNORMAL LOW (ref 32.0–36.0)
MCV: 75.2 fL — ABNORMAL LOW (ref 80.0–100.0)
MPV: 12.1 fL (ref 7.5–12.5)
Platelets: 354 10*3/uL (ref 140–400)
RBC: 4.07 10*6/uL (ref 3.80–5.10)
RDW: 19.9 % — ABNORMAL HIGH (ref 11.0–15.0)
WBC: 2.9 10*3/uL — ABNORMAL LOW (ref 3.8–10.8)

## 2018-09-24 NOTE — Patient Instructions (Signed)
1. Fibroids Multiple large intramural uterine fibroids.  Will observe on the progestin pill as patient desires preservation of eventual fertility.  2. Endometrial polyp Sonohysterogram findings reviewed with patient.  Informed that an intra-uterine lesion is present compatible with an endometrial polyp.  Decision to proceed with hysteroscopy with excision of the intrauterine lesion using MyoSure and a D&C.  Pamphlet and information given to patient today.  We will follow-up for a preop visit with me.  3. Menorrhagia with regular cycle Menorrhagia with secondary anemia.  Hemoglobin was 7.5 on July 30, 2018.  Now on the progestin only pill and iron supplements.  Recheck a CBC today. - CBC  4. Secondary anemia As above. - CBC  Cheryl [name], it was a pleasure seeing you today!  I will inform you of your results as soon as they are available and will see you again soon for the preop visit.

## 2018-09-24 NOTE — Progress Notes (Signed)
    Cheryl [name] June 28, 1995 [id number]        23 y.o.  G0  Same sex partner  RP: Fibroids/Menorrhagia/Secondary anemia for Sonohysterogram  HPI: Multiple large Fibroids per Pelvic US 08/29/2018.  Hb was 7.5 on 07/30/2018.  On the Progestin pill since 08/29/2018.  Moderate to heavy period currently.   OB History  Gravida Para Term Preterm AB Living  0 0 0 0 0 0  SAB TAB Ectopic Multiple Live Births  0 0 0 0 0    Past medical history,surgical history, problem list, medications, allergies, family history and social history were all reviewed and documented in the EPIC chart.   Directed ROS with pertinent positives and negatives documented in the history of present illness/assessment and plan.  Exam:  There were no vitals filed for this visit. General appearance:  Normal                                                                    Sono Infusion Hysterogram ( procedure note)   The initial transvaginal ultrasound demonstrated the following: T/V images.  Uterus/fibroid/ovaries measured on pelvic ultrasound in Aug 29, 2018.  Multiple fibroids adjacent to the endometrial cavity.  The speculum  was inserted and the cervix cleansed with Betadine solution after confirming that patient has no allergies.A small sonohysterography catheterwas utilized.  Insertion was facilitated with ring forceps, using a spear-like motion the catheter was inserted to the fundus of the uterus. The speculum is then removed carefully to avoid dislodging the catheter. The catheter was flushed with sterile saline delete prior to insertion to rid it of small amounts of air.the sterile saline solution was infused into the uterine cavity as a vaginal ultrasound probe was then placed in the vagina for full visualization of the uterine cavity from a transvaginal approach. The following was noted: Saline infusion reveals an endometrial cavity distorted by fibroids and a 2.1 x 0.9 cm intracavitary mass compatible with an  endometrial polyp.   The catheter was then removed after retrieving some of the saline from the intrauterine cavity. An endometrial biopsy was not done. Patient tolerated procedure well. She had received a tablet of Aleve for discomfort.    Assessment/Plan:  23 y.o. G0  1. Fibroids Multiple large intramural uterine fibroids.  Will observe on the progestin pill as patient desires preservation of eventual fertility.  2. Endometrial polyp Sonohysterogram findings reviewed with patient.  Informed that an intra-uterine lesion is present compatible with an endometrial polyp.  Decision to proceed with hysteroscopy with excision of the intrauterine lesion using MyoSure and a D&C.  Pamphlet and information given to patient today.  We will follow-up for a preop visit with me.  3. Menorrhagia with regular cycle Menorrhagia with secondary anemia.  Hemoglobin was 7.5 on July 30, 2018.  Now on the progestin only pill and iron supplements.  Recheck a CBC today. - CBC  4. Secondary anemia As above. - CBC  Counseling on above issues and coordination of care more than 50% for 15 minutes.  Princess Bruins MD, 11:35 AM 09/24/2018

## 2018-09-26 ENCOUNTER — Telehealth: Payer: Self-pay

## 2018-09-26 NOTE — Telephone Encounter (Signed)
Left message for patient to call me to discuss lab results and scheduling surgery.

## 2018-09-26 NOTE — Telephone Encounter (Signed)
Patient called. We reviewed result note on her lab result. Also discuss ins benefits and estimated surgery prepymt. She is on her [employer] and asked if I would send financial letter first and let her discuss with her mom. I will include my direct phone number and she will call me when she is ready to schedule.

## 2019-01-20 ENCOUNTER — Telehealth (INDEPENDENT_AMBULATORY_CARE_PROVIDER_SITE_OTHER): Payer: Self-pay | Admitting: Family

## 2019-01-20 DIAGNOSIS — G40209 Localization-related (focal) (partial) symptomatic epilepsy and epileptic syndromes with complex partial seizures, not intractable, without status epilepticus: Secondary | ICD-10-CM

## 2019-01-20 DIAGNOSIS — G40309 Generalized idiopathic epilepsy and epileptic syndromes, not intractable, without status epilepticus: Secondary | ICD-10-CM

## 2019-01-20 MED ORDER — LEVETIRACETAM 250 MG PO TABS: ORAL_TABLET | ORAL | 0 refills | Status: DC

## 2019-01-20 NOTE — Telephone Encounter (Signed)
30 day refill has been sent to the pharmacy ?

## 2019-02-07 ENCOUNTER — Other Ambulatory Visit: Payer: Commercial Managed Care - PPO

## 2019-02-18 ENCOUNTER — Telehealth: Payer: Self-pay | Admitting: *Deleted

## 2019-02-18 NOTE — Telephone Encounter (Signed)
Pt called on 02/17/19 with questions regarding potential surgery.  LM today for pt to call back KW CMA

## 2019-02-20 NOTE — Telephone Encounter (Signed)
Pt didn't call back KW CMA

## 2019-02-24 ENCOUNTER — Other Ambulatory Visit (INDEPENDENT_AMBULATORY_CARE_PROVIDER_SITE_OTHER): Payer: Self-pay | Admitting: Family

## 2019-02-24 DIAGNOSIS — G40309 Generalized idiopathic epilepsy and epileptic syndromes, not intractable, without status epilepticus: Secondary | ICD-10-CM

## 2019-02-24 DIAGNOSIS — G40209 Localization-related (focal) (partial) symptomatic epilepsy and epileptic syndromes with complex partial seizures, not intractable, without status epilepticus: Secondary | ICD-10-CM

## 2019-02-24 MED ORDER — LEVETIRACETAM 250 MG PO TABS: ORAL_TABLET | ORAL | 0 refills | Status: DC

## 2019-02-24 NOTE — Telephone Encounter (Signed)
I received a refill request for Levetiracetam. Cheryl [name] was last seen March 2019. She needs an appointment in order to refill the medication. I attempted to call her but there was no answer. I will authorize a 1 week supply and send her a letter. If she does not respond in a week, I will no longer fill the medication. TG

## 2019-02-26 ENCOUNTER — Telehealth: Payer: Self-pay

## 2019-02-26 NOTE — Telephone Encounter (Signed)
I called patient but phone number is out of order and I cannot reach her.

## 2019-02-28 ENCOUNTER — Telehealth (INDEPENDENT_AMBULATORY_CARE_PROVIDER_SITE_OTHER): Payer: Self-pay | Admitting: Family

## 2019-02-28 DIAGNOSIS — G40309 Generalized idiopathic epilepsy and epileptic syndromes, not intractable, without status epilepticus: Secondary | ICD-10-CM

## 2019-02-28 DIAGNOSIS — G40209 Localization-related (focal) (partial) symptomatic epilepsy and epileptic syndromes with complex partial seizures, not intractable, without status epilepticus: Secondary | ICD-10-CM

## 2019-02-28 MED ORDER — LEVETIRACETAM 250 MG PO TABS: ORAL_TABLET | ORAL | 0 refills | Status: DC

## 2019-02-28 NOTE — Telephone Encounter (Signed)
I spoke with Cheryl [name] and we decided to give 1 more month of medication.  Cheryl [name] is going to have a letter written that dismisses her from the practice.

## 2019-02-28 NOTE — Telephone Encounter (Signed)
°  Who's calling (name and relationship to patient) : Abria (patient)  Best contact number: [phone]  Provider they see: [facility]   Reason for call: Patient need seizure medication until she get appt with her adult neuro provider. She is currently out of medication.      PRESCRIPTION REFILL ONLY  Name of prescription: Levetiracetam 250mg     Pharmacy [facility] on [address]

## 2019-03-02 NOTE — Progress Notes (Signed)
Established Patient Office Visit  Subjective:  Patient ID: Cheryl [name], female    DOB: 15-Jan-1996  Age: 23 y.o. MRN: [id number]  CC:  Chief Complaint  Patient presents with   Medication Refill    HPI Cheryl [name] presents for   She has been on Keppra since age 8. She reports that her last seizure was 2018.  She can tell when she is about to have a seizure and reports that it is typically triggered by stress.  She states that she had a Neurologist March 2019 by Rockwell Germany NP.  She was going to [facility] and was given the option of leaving the practice since she is 23 and this was a pediatric practice.  She denies any history of substance abuse, depression and anxiety. She is interested in working with a Neurologist to help her understand the disease.  She also thinks since stress is a trigger for her seizure she would like to work with a Neurology who can help her with understanding how would be the best to manage it.  If she misses her doses she does not have seizures. She is wondering if missing doses makes her stressed and causes her seizures and maybe if she manages her stress she can get off the keppra.  She just wants to understand more.   She is married in a homsexual relationship with a supportive partner.  She works at [employer] where she works at [employer] in [employer].  She is not doing any heavy lifting.   Past Medical History:  Diagnosis Date   Seizures ([place])     Past Surgical History:  Procedure Laterality Date   NO PAST SURGERIES      Family History  Problem Relation Age of Onset   Hypertension Maternal Grandmother    Diabetes Maternal Grandmother     Social History   Socioeconomic History   Marital status: Married    Spouse name: Not on file   Number of children: Not on file   Years of education: Not on file   Highest education level: Not on file  Occupational History   Not on file  Social Needs   Financial resource strain: Not on  file   Food insecurity    Worry: Not on file    Inability: Not on file   Transportation needs    Medical: Not on file    Non-medical: Not on file  Tobacco Use   Smoking status: Never Smoker   Smokeless tobacco: Never Used  Substance and Sexual Activity   Alcohol use: Yes    Comment: One or twice a month-Socially   Drug use: No   Sexual activity: Yes    Partners: Female  Lifestyle   Physical activity    Days per week: Not on file    Minutes per session: Not on file   Stress: Not on file  Relationships   Social connections    Talks on phone: Not on file    Gets together: Not on file    Attends religious service: Not on file    Active member of club or organization: Not on file    Attends meetings of clubs or organizations: Not on file    Relationship status: Not on file   Intimate partner violence    Fear of current or ex partner: Not on file    Emotionally abused: Not on file    Physically abused: Not on file    Forced sexual activity: Not  on file  Other Topics Concern   Not on file  Social History Narrative   Cheryl [name] is currently enrolled at [employer] in her second year of college and works at [employer] 24 hours a week.   Cheryl [name] lives with her mother and sibling.   Avika enjoys music, reading, and walking downtown.   Jarrod is doing well in school.    Outpatient Medications Prior to Visit  Medication Sig Dispense Refill   levETIRAcetam (KEPPRA) 250 MG tablet TAKE 2 TABLETS BY MOUTH EVERY MORNING AND 3 TABLETS EVERY NIGHT AT BEDTIME 155 tablet 0   norethindrone (MICRONOR) 0.35 MG tablet Take 1 tablet (0.35 mg total) by mouth daily. (Patient not taking: Reported on 03/03/2019) 3 Package 4   No facility-administered medications prior to visit.     No Known Allergies  ROS Review of Systems Review of Systems  Constitutional: Negative for activity change, appetite change, chills and fever.  HENT: Negative for congestion, nosebleeds, trouble swallowing and voice change.     Respiratory: Negative for cough, shortness of breath and wheezing.   Gastrointestinal: Negative for diarrhea, nausea and vomiting.  Genitourinary: Negative for difficulty urinating, dysuria, flank pain and hematuria.  Musculoskeletal: Negative for back pain, joint swelling and neck pain.  Neurological: Negative for dizziness, speech difficulty, light-headedness and numbness.  See HPI. All other review of systems negative.      Objective:    Physical Exam  BP 117/76    Pulse 82    Temp 97.6 F (36.4 C) (Oral)    Ht 5' (1.524 m)    Wt 116 lb 9.6 oz (52.9 kg)    LMP 01/23/2019    SpO2 100%    BMI 22.77 kg/m  Wt Readings from Last 3 Encounters:  03/03/19 116 lb 9.6 oz (52.9 kg)  07/30/18 108 lb (49 kg)  07/27/17 113 lb (51.3 kg)   Physical Exam  Constitutional: Oriented to person, place, and time. Appears well-developed and well-nourished.  HENT:  Head: Normocephalic and atraumatic.  Eyes: Conjunctivae and EOM are normal.  Cardiovascular: Normal rate, regular rhythm, normal heart sounds and intact distal pulses.  No murmur heard. Pulmonary/Chest: Effort normal and breath sounds normal. No stridor. No respiratory distress. Has no wheezes.  Neurological: Is alert and oriented to person, place, and time. Patellar tendon reflex 2+ bilaterally Skin: Skin is warm. Capillary refill takes less than 2 seconds.  Psychiatric: Has a normal mood and affect. Behavior is normal. Judgment and thought content normal.    Health Maintenance Due  Topic Date Due   HIV Screening  10/02/2008   TETANUS/TDAP  10/02/2012   INFLUENZA VACCINE  11/09/2018    There are no preventive care reminders to display for this patient.  Lab Results  Component Value Date   TSH 1.990 05/30/2017   Lab Results  Component Value Date   WBC 3.8 (A) 03/03/2019   HGB 8.9 (A) 03/03/2019   HCT 27.9 (A) 03/03/2019   MCV 69.9 (A) 03/03/2019   PLT 354 09/24/2018   Lab Results  Component Value Date   NA 138  05/30/2017   K 4.1 05/30/2017   CO2 22 05/30/2017   GLUCOSE 88 05/30/2017   BUN 14 05/30/2017   CREATININE 0.74 05/30/2017   BILITOT <0.2 05/30/2017   ALKPHOS 68 05/30/2017   AST 17 05/30/2017   ALT 9 05/30/2017   PROT 6.7 05/30/2017   ALBUMIN 4.1 05/30/2017   CALCIUM 9.2 05/30/2017   Lab Results  Component Value Date  CHOL 128 05/30/2017   Lab Results  Component Value Date   HDL 62 05/30/2017   Lab Results  Component Value Date   LDLCALC 56 05/30/2017   Lab Results  Component Value Date   TRIG 49 05/30/2017   Lab Results  Component Value Date   CHOLHDL 2.1 05/30/2017   No results found for: HGBA1C    Assessment & Plan:   Problem List Items Addressed This Visit      Nervous and Auditory   Partial epilepsy with impairment of consciousness ([place])   Relevant Medications   levETIRAcetam (KEPPRA) 250 MG tablet   Generalized convulsive epilepsy ([place]) - Primary   Relevant Medications   levETIRAcetam (KEPPRA) 250 MG tablet   Other Relevant Orders   CMP14+EGFR   Ambulatory referral to Neurology   POCT CBC (Completed)    Other Visit Diagnoses    Encounter for medication monitoring       Relevant Orders   CMP14+EGFR   POCT CBC (Completed)   Iron deficiency anemia due to chronic blood loss         Discussed referral to Neurology Will refill 90 days supply of Keppra Follow up with Neurology  Continue iron for IDA due to fibroid and heavy bleeding She is taking twice a day   Meds ordered this encounter  Medications   levETIRAcetam (KEPPRA) 250 MG tablet    Sig: TAKE 2 TABLETS BY MOUTH EVERY MORNING AND 3 TABLETS EVERY NIGHT AT BEDTIME    Dispense:  450 tablet    Refill:  0    Follow-up: No follow-ups on file.   A total of 25 minutes were spent face-to-face with the patient during this encounter and over half of that time was spent on counseling and coordination of care.  Forrest Moron, MD

## 2019-03-03 ENCOUNTER — Encounter: Payer: Self-pay | Admitting: Neurology

## 2019-03-03 ENCOUNTER — Telehealth: Payer: Self-pay

## 2019-03-03 ENCOUNTER — Encounter: Payer: Self-pay | Admitting: Family Medicine

## 2019-03-03 ENCOUNTER — Ambulatory Visit: Payer: BC Managed Care – PPO | Admitting: Family Medicine

## 2019-03-03 ENCOUNTER — Other Ambulatory Visit: Payer: Self-pay

## 2019-03-03 VITALS — BP 117/76 | HR 82 | Temp 97.6°F | Ht 60.0 in | Wt 116.6 lb

## 2019-03-03 DIAGNOSIS — G40309 Generalized idiopathic epilepsy and epileptic syndromes, not intractable, without status epilepticus: Secondary | ICD-10-CM | POA: Diagnosis not present

## 2019-03-03 DIAGNOSIS — D500 Iron deficiency anemia secondary to blood loss (chronic): Secondary | ICD-10-CM | POA: Diagnosis not present

## 2019-03-03 DIAGNOSIS — Z5181 Encounter for therapeutic drug level monitoring: Secondary | ICD-10-CM

## 2019-03-03 DIAGNOSIS — G40209 Localization-related (focal) (partial) symptomatic epilepsy and epileptic syndromes with complex partial seizures, not intractable, without status epilepticus: Secondary | ICD-10-CM

## 2019-03-03 LAB — POCT CBC
Granulocyte percent: 38.8 %G (ref 37–80)
HCT, POC: 27.9 % — AB (ref 29–41)
Hemoglobin: 8.9 g/dL — AB (ref 11–14.6)
Lymph, poc: 2 (ref 0.6–3.4)
MCH, POC: 22.2 pg — AB (ref 27–31.2)
MCHC: 31.8 g/dL (ref 31.8–35.4)
MCV: 69.9 fL — AB (ref 76–111)
MID (cbc): 0.3 (ref 0–0.9)
MPV: 8.8 fL (ref 0–99.8)
POC Granulocyte: 1.5 — AB (ref 2–6.9)
POC LYMPH PERCENT: 52.7 %L — AB (ref 10–50)
POC MID %: 8.5 %M (ref 0–12)
Platelet Count, POC: 250 10*3/uL (ref 142–424)
RBC: 3.99 M/uL — AB (ref 4.04–5.48)
RDW, POC: 16.8 %
WBC: 3.8 10*3/uL — AB (ref 4.6–10.2)

## 2019-03-03 MED ORDER — LEVETIRACETAM 250 MG PO TABS: ORAL_TABLET | ORAL | 0 refills | Status: DC

## 2019-03-03 NOTE — Telephone Encounter (Signed)
I called patient to discuss scheduling surgery. I let her know that our surgery schedule is full and we are awaiting January schedule and I will call her when that is available. We discussed her insurance and I discussed her estimated surgery prepymt amount and that it is due by week before surgery. I will call her when I can schedule this.

## 2019-03-03 NOTE — Patient Instructions (Signed)
° ° ° °  If you have lab work done today you will be contacted with your lab results within the next 2 weeks.  If you have not heard from us then please contact us. The fastest way to get your results is to register for My Chart. ° ° °IF you received an x-ray today, you will receive an invoice from [facility]. Please contact [facility] at [phone] with questions or concerns regarding your invoice.  ° °IF you received labwork today, you will receive an invoice from [facility]. Please contact [facility] at [phone] with questions or concerns regarding your invoice.  ° °Our billing staff will not be able to assist you with questions regarding bills from these companies. ° °You will be contacted with the lab results as soon as they are available. The fastest way to get your results is to activate your My Chart account. Instructions are located on the last page of this paperwork. If you have not heard from us regarding the results in 2 weeks, please contact this office. °  ° ° ° °

## 2019-03-04 LAB — CMP14+EGFR
ALT: 8 IU/L (ref 0–32)
AST: 18 IU/L (ref 0–40)
Albumin/Globulin Ratio: 1.6 (ref 1.2–2.2)
Albumin: 4.2 g/dL (ref 3.9–5.0)
Alkaline Phosphatase: 68 IU/L (ref 39–117)
BUN/Creatinine Ratio: 18 (ref 9–23)
BUN: 13 mg/dL (ref 6–20)
Bilirubin Total: 0.3 mg/dL (ref 0.0–1.2)
CO2: 21 mmol/L (ref 20–29)
Calcium: 9.2 mg/dL (ref 8.7–10.2)
Chloride: 102 mmol/L (ref 96–106)
Creatinine, Ser: 0.71 mg/dL (ref 0.57–1.00)
GFR calc Af Amer: 137 mL/min/{1.73_m2} (ref >59)
GFR calc non Af Amer: 119 mL/min/{1.73_m2} (ref >59)
Globulin, Total: 2.7 g/dL (ref 1.5–4.5)
Glucose: 86 mg/dL (ref 65–99)
Potassium: 4.1 mmol/L (ref 3.5–5.2)
Sodium: 138 mmol/L (ref 134–144)
Total Protein: 6.9 g/dL (ref 6.0–8.5)

## 2019-03-10 ENCOUNTER — Encounter: Payer: Self-pay | Admitting: Family Medicine

## 2019-03-11 ENCOUNTER — Other Ambulatory Visit: Payer: Self-pay

## 2019-03-11 ENCOUNTER — Encounter: Payer: Self-pay | Admitting: Neurology

## 2019-03-11 ENCOUNTER — Ambulatory Visit: Payer: BC Managed Care – PPO | Admitting: Neurology

## 2019-03-11 DIAGNOSIS — G40309 Generalized idiopathic epilepsy and epileptic syndromes, not intractable, without status epilepticus: Secondary | ICD-10-CM

## 2019-03-11 MED ORDER — LEVETIRACETAM 250 MG PO TABS: ORAL_TABLET | ORAL | 11 refills | Status: DC

## 2019-03-11 NOTE — Progress Notes (Signed)
NEUROLOGY CONSULTATION NOTE  Cheryl [name] MRN: [id number] DOB: [birth year]  Referring provider: Dr. Delia Chimes Primary care provider: Dr. Delia Chimes  Reason for consult:  Generalized convulsive epilepsy  Dear Dr Nolon Rod:  Thank you for your kind referral of Cheryl [name] for consultation of the above symptoms. Although her history is well known to you, please allow me to reiterate it for the purpose of our medical record. She is alone in the office today. Records and images were personally reviewed where available.   HISTORY OF PRESENT ILLNESS: This is a pleasant 23 year old right-handed woman presenting to establish adult epilepsy care. Records from her pediatric neurologist were reviewed and will be summarized below. Seizures started at age 63. She recalls the first seizure, she was not feeling well and took medication with soda on an empty stomach. She had been holding her baby cousin then woke up on the floor. Her aunt told her she had a seizure with "light shaking." She saw pediatric neurologist Dr. Gaynell [name]. Per notes, MRI brain in February 2008 was normal. She had an EEG in February 2008 and July 2011 which were normal. She was thought to have a generalized seizure disorder, then had focal seizures involving twitching of her eyes and gazing at the ceiling. She was loaded with Dilantin at that time and switched to Depakote. She was later changed to Levetiracetam due to continued seizures. She has had good seizure control when she is compliant with Levetiracetam. She did well for several years and medication taper was attempted in 2012, but after being off medication for one day, she had a seizure in 12/2010. She had a seizure in 06/2017 after missing 3 or 4 doses. She had 2 seizures in one day last 10/31/2017 witnessed by her wife that occurred in the setting of sleep deprivation. She believes this was her last seizure. She has occasional auras where her hands will get clammy and she spaces  out "just a tiny bit," something feels off for a second and her equilibrium starts to sway. She denies any recent staring/unresponsive episodes. She is usually lethargic and sleepy after a seizure. No nocturnal seizures. She denies any olfactory/gustatory hallucinations, focal numbness/tingling/weakness, myoclonic jerks. She has been on Levetiracetam 250mg  2 tabs in AM, 3 tabs in PM with no side effects. She feels it also equalizes her mood. She has noticed stress can also contribute to seizures. She is now in a stress-free environment and has missed doses with no seizure activity. Mood and sleep are good. She denies any headaches, dizziness, vision changes, neck/back pain, bowel/bladder dysfunction. No falls. She works in an [employer]. She is hoping for pregnancy within the next 2 years.   Epilepsy Risk Factors:  She had a normal birth and early development.  There is no history of febrile convulsions, CNS infections such as meningitis/encephalitis, significant traumatic brain injury, neurosurgical procedures, or family history of seizures.  PAST MEDICAL HISTORY: Past Medical History:  Diagnosis Date  . Fibroids   . Seizures ([place])     PAST SURGICAL HISTORY: Past Surgical History:  Procedure Laterality Date  . NO PAST SURGERIES      MEDICATIONS: Current Outpatient Medications on File Prior to Visit  Medication Sig Dispense Refill  . Ferrous Sulfate (IRON PO) Take by mouth.    . levETIRAcetam (KEPPRA) 250 MG tablet TAKE 2 TABLETS BY MOUTH EVERY MORNING AND 3 TABLETS EVERY NIGHT AT BEDTIME 450 tablet 0   No current facility-administered medications on file prior to  visit.     ALLERGIES: No Known Allergies  FAMILY HISTORY: Family History  Problem Relation Age of Onset  . Hypertension Maternal Grandmother   . Diabetes Maternal Grandmother     SOCIAL HISTORY: Social History   Socioeconomic History  . Marital status: Married    Spouse name: Not on file  . Number of children:  Not on file  . Years of education: Not on file  . Highest education level: Not on file  Occupational History  . Not on file  Social Needs  . Financial resource strain: Not on file  . Food insecurity    Worry: Not on file    Inability: Not on file  . Transportation needs    Medical: Not on file    Non-medical: Not on file  Tobacco Use  . Smoking status: Never Smoker  . Smokeless tobacco: Never Used  Substance and Sexual Activity  . Alcohol use: Yes    Comment: One or twice a month-Socially  . Drug use: No  . Sexual activity: Yes    Partners: Female  Lifestyle  . Physical activity    Days per week: Not on file    Minutes per session: Not on file  . Stress: Not on file  Relationships  . Social Herbalist on phone: Not on file    Gets together: Not on file    Attends religious service: Not on file    Active member of club or organization: Not on file    Attends meetings of clubs or organizations: Not on file    Relationship status: Not on file  . Intimate partner violence    Fear of current or ex partner: Not on file    Emotionally abused: Not on file    Physically abused: Not on file    Forced sexual activity: Not on file  Other Topics Concern  . Not on file  Social History Narrative   [name] is currently enrolled at [employer] in her second year of college and works at [employer] 24 hours a week.   [name] lives with her mother and sibling.   [name] enjoys music, reading, and walking downtown.   [name] is doing well in school.    REVIEW OF SYSTEMS: Constitutional: No fevers, chills, or sweats, no generalized fatigue, change in appetite Eyes: No visual changes, double vision, eye pain Ear, nose and throat: No hearing loss, ear pain, nasal congestion, sore throat Cardiovascular: No chest pain, palpitations Respiratory:  No shortness of breath at rest or with exertion, wheezes GastrointestinaI: No nausea, vomiting, diarrhea, abdominal pain, fecal incontinence Genitourinary:  No  dysuria, urinary retention or frequency Musculoskeletal:  No neck pain, back pain Integumentary: No rash, pruritus, skin lesions Neurological: as above Psychiatric: No depression, insomnia, anxiety Endocrine: No palpitations, fatigue, diaphoresis, mood swings, change in appetite, change in weight, increased thirst Hematologic/Lymphatic:  No anemia, purpura, petechiae. Allergic/Immunologic: no itchy/runny eyes, nasal congestion, recent allergic reactions, rashes  PHYSICAL EXAM: Vitals:   03/11/19 0856  BP: 112/73  Pulse: 82  SpO2: 100%   General: No acute distress Head:  Normocephalic/atraumatic Skin/Extremities: No rash, no edema Neurological Exam: Mental status: alert and oriented to person, place, and time, no dysarthria or aphasia, Fund of knowledge is appropriate.  Recent and remote memory are intact. 3/3 delayed recall. Attention and concentration are normal.    Able to name objects and repeat phrases. Cranial nerves: CN I: not tested CN II: pupils equal, round and reactive to light,  visual fields intact CN III, IV, VI:  full range of motion, no nystagmus, no ptosis CN V: facial sensation intact CN VII: upper and lower [name] symmetric CN VIII: hearing intact to conversation CN IX, X: gag intact, uvula midline CN XI: sternocleidomastoid and trapezius muscles intact CN XII: tongue midline Bulk & Tone: normal, no fasciculations. Motor: 5/5 throughout with no pronator drift. Sensation: intact to light touch, cold, pin, vibration and joint position sense.  No extinction to double simultaneous stimulation.  Romberg test negative Deep Tendon Reflexes: +2 throughout, no ankle clonus Plantar responses: downgoing bilaterally Cerebellar: no incoordination on finger to nose testing Gait: narrow-based and steady, able to tandem walk adequately. Tremor: none  IMPRESSION: This is a pleasant 23 year old right-handed woman with a history of seizures since age 32 suggestive of primary  generalized epilepsy. MRI brain unremarkable, EEG x 2 normal. She denies any GTCs since 10/2017. Continue Levetiracetam 250mg  2 tabs in AM, 3 tabs in PM, refills sent. We had an extensive discussion about the diagnosis of epilepsy, medication management, avoidance of seizure triggers, and issues in women with epilepsy. No current pregnancy plans, possibly in 2 years.  [place] driving laws were discussed with the patient, and she knows to stop driving after a seizure, until 6 months seizure-free. She will follow-up in 6-7 months and knows to call for any changes.   Thank you for allowing me to participate in the care of this patient. Please do not hesitate to call for any questions or concerns.   Ellouise Newer, M.D.  CC: Dr. Nolon Rod

## 2019-03-11 NOTE — Patient Instructions (Signed)
Great meeting you! Continue Keppra 250mg : take 2 tablets in AM, 3 tablets in PM. Follow-up in 6-7 months, call for any changes.  Seizure Precautions: 1. If medication has been prescribed for you to prevent seizures, take it exactly as directed.  Do not stop taking the medicine without talking to your doctor first, even if you have not had a seizure in a long time.   2. Avoid activities in which a seizure would cause danger to yourself or to others.  Don't operate dangerous machinery, swim alone, or climb in high or dangerous places, such as on ladders, roofs, or girders.  Do not drive unless your doctor says you may.  3. If you have any warning that you may have a seizure, lay down in a safe place where you can't hurt yourself.    4.  No driving for 6 months from last seizure, as per [facility].   Please refer to the following link on the [facility] website for more information: http://[email or web address]/[email or web address]/drivingu.cfm   5.  Maintain good sleep hygiene. Avoid alcohol.  6.  Notify your neurology if you are planning pregnancy or if you become pregnant.  7.  Contact your doctor if you have any problems that may be related to the medicine you are taking.  8.  Call 911 and bring the patient back to the ED if:        A.  The seizure lasts longer than 5 minutes.       B.  The patient doesn't awaken shortly after the seizure  C.  The patient has new problems such as difficulty seeing, speaking or moving  D.  The patient was injured during the seizure  E.  The patient has a temperature over 102 F (39C)  F.  The patient vomited and now is having trouble breathing

## 2019-03-17 ENCOUNTER — Telehealth: Payer: Self-pay

## 2019-03-17 NOTE — Telephone Encounter (Signed)
I called patient to discuss January dates available for surgery. She wants to consider and call me back.

## 2019-05-26 ENCOUNTER — Telehealth: Payer: Self-pay | Admitting: Family Medicine

## 2019-05-26 NOTE — Telephone Encounter (Signed)
I left message on pt's vm letting her know if she needs a transfer of medical info. I would need to speak with her or she can fill out a release.

## 2019-07-04 ENCOUNTER — Other Ambulatory Visit: Payer: Self-pay

## 2019-07-04 ENCOUNTER — Emergency Department (HOSPITAL_COMMUNITY)
Admission: EM | Admit: 2019-07-04 | Discharge: 2019-07-05 | Disposition: A | Discharge status: Home / Self Care | Payer: 59 | Attending: Emergency Medicine | Admitting: Emergency Medicine

## 2019-07-04 DIAGNOSIS — Z79899 Other long term (current) drug therapy: Secondary | ICD-10-CM | POA: Insufficient documentation

## 2019-07-04 DIAGNOSIS — D509 Iron deficiency anemia, unspecified: Secondary | ICD-10-CM | POA: Diagnosis not present

## 2019-07-04 DIAGNOSIS — R569 Unspecified convulsions: Secondary | ICD-10-CM | POA: Diagnosis present

## 2019-07-05 ENCOUNTER — Encounter (HOSPITAL_COMMUNITY): Payer: Self-pay | Admitting: Emergency Medicine

## 2019-07-05 ENCOUNTER — Emergency Department (HOSPITAL_COMMUNITY): Payer: 59

## 2019-07-05 LAB — COMPREHENSIVE METABOLIC PANEL
ALT: 29 U/L (ref 0–44)
AST: 52 U/L — ABNORMAL HIGH (ref 15–41)
Albumin: 4.1 g/dL (ref 3.5–5.0)
Alkaline Phosphatase: 52 U/L (ref 38–126)
Anion gap: 12 (ref 5–15)
BUN: 14 mg/dL (ref 6–20)
CO2: 23 mmol/L (ref 22–32)
Calcium: 9.6 mg/dL (ref 8.9–10.3)
Chloride: 104 mmol/L (ref 98–111)
Creatinine, Ser: 0.69 mg/dL (ref 0.44–1.00)
GFR calc Af Amer: >60 mL/min (ref >60)
GFR calc non Af Amer: >60 mL/min (ref >60)
Glucose, Bld: 82 mg/dL (ref 70–99)
Potassium: 3.4 mmol/L — ABNORMAL LOW (ref 3.5–5.1)
Sodium: 139 mmol/L (ref 135–145)
Total Bilirubin: 0.4 mg/dL (ref 0.3–1.2)
Total Protein: 7.4 g/dL (ref 6.5–8.1)

## 2019-07-05 LAB — URINALYSIS, ROUTINE W REFLEX MICROSCOPIC
Bacteria, UA: NONE SEEN
Bilirubin Urine: NEGATIVE
Glucose, UA: NEGATIVE mg/dL
Ketones, ur: NEGATIVE mg/dL
Leukocytes,Ua: NEGATIVE
Nitrite: NEGATIVE
Protein, ur: NEGATIVE mg/dL
Specific Gravity, Urine: 1.023 (ref 1.005–1.030)
pH: 5 (ref 5.0–8.0)

## 2019-07-05 LAB — CBC
HCT: 29.7 % — ABNORMAL LOW (ref 36.0–46.0)
Hemoglobin: 8.2 g/dL — ABNORMAL LOW (ref 12.0–15.0)
MCH: 19.5 pg — ABNORMAL LOW (ref 26.0–34.0)
MCHC: 27.6 g/dL — ABNORMAL LOW (ref 30.0–36.0)
MCV: 70.5 fL — ABNORMAL LOW (ref 80.0–100.0)
Platelets: 465 10*3/uL — ABNORMAL HIGH (ref 150–400)
RBC: 4.21 MIL/uL (ref 3.87–5.11)
RDW: 18 % — ABNORMAL HIGH (ref 11.5–15.5)
WBC: 4.6 10*3/uL (ref 4.0–10.5)
nRBC: 0 % (ref 0.0–0.2)

## 2019-07-05 LAB — MAGNESIUM: Magnesium: 1.8 mg/dL (ref 1.7–2.4)

## 2019-07-05 LAB — I-STAT BETA HCG BLOOD, ED (MC, WL, AP ONLY): I-stat hCG, quantitative: <5 m[IU]/mL (ref <5)

## 2019-07-05 LAB — CBG MONITORING, ED: Glucose-Capillary: 99 mg/dL (ref 70–99)

## 2019-07-05 MED ORDER — LEVETIRACETAM IN NACL 1000 MG/100ML IV SOLN
1000.0000 mg | Freq: Once | INTRAVENOUS | Status: AC
  Administered 2019-07-05: 02:00:00 1000 mg via INTRAVENOUS
  Filled 2019-07-05: qty 100

## 2019-07-05 MED ORDER — SODIUM CHLORIDE 0.9 % IV BOLUS (SEPSIS)
1000.0000 mL | Freq: Once | INTRAVENOUS | Status: AC
  Administered 2019-07-05: 1000 mL via INTRAVENOUS

## 2019-07-05 MED ORDER — LEVETIRACETAM 500 MG PO TABS: 500.0000 mg | ORAL_TABLET | ORAL | 1 refills | Status: DC

## 2019-07-05 NOTE — ED Provider Notes (Signed)
TIME SEEN: 12:49 AM  CHIEF COMPLAINT: Seizure  HPI: Patient is a 24 year old female with history of epilepsy since she was 24 years old on Keppra 500 mg in the morning and 750 mg at night, uterine fibroids who presents to the emergency department with 2 generalized tonic-clonic seizures that occurred tonight.  Wife reports that patient had just taken her medication which patient reports compliance with and she went to lay down to go to bed and started feeling like her heart was racing and having a posterior headache.  Wife reports that she then had 2 back-to-back generalized tonic-clonic seizures both lasting approximately 45 to 50 seconds.  She states there is only several seconds in between each seizure.  Her seizure activity seem typical of her previous seizure activity.  She states she has been working out more than normal and wonders if this could be contributing.  No increased stress, lack of sleep, fevers, cough, vomiting, diarrhea, drug or alcohol use.  She is followed by Dr. Delice Lesch with [facility].  Reports her seizures have been well controlled on Keppra and her last seizure was 2 years ago in July 2019.  She has previously been on Dilantin and Depakote without good seizure control.  Denies having any chest pain or shortness of breath.  Only mild posterior headache currently but states headache is an abnormal feature for her.  No numbness or focal weakness.  Has been on her menstrual cycle and reports they are heavy which is normal and last approximately 5 days.  She is being followed by Dr. Dellis Filbert with [facility] for her uterine fibroids.  ROS: See HPI Constitutional: no fever  Eyes: no drainage  ENT: no runny nose   Cardiovascular:  no chest pain  Resp: no SOB  GI: no vomiting GU: no dysuria Integumentary: no rash  Allergy: no hives  Musculoskeletal: no leg swelling  Neurological: no slurred speech ROS otherwise negative  PAST MEDICAL HISTORY/PAST SURGICAL  HISTORY:  Past Medical History:  Diagnosis Date  . Fibroids   . Seizures ([place])     MEDICATIONS:  Prior to Admission medications   Medication Sig Start Date End Date Taking? Authorizing Provider  Ferrous Sulfate (IRON PO) Take by mouth.    [provider]  levETIRAcetam (KEPPRA) 250 MG tablet TAKE 2 TABLETS BY MOUTH EVERY MORNING AND 3 TABLETS EVERY NIGHT AT BEDTIME 03/11/19   Cameron Sprang, MD    ALLERGIES:  No Known Allergies  SOCIAL HISTORY:  Social History   Tobacco Use  . Smoking status: Never Smoker  . Smokeless tobacco: Never Used  Substance Use Topics  . Alcohol use: Yes    Comment: One or twice a month-Socially    FAMILY HISTORY: Family History  Problem Relation Age of Onset  . Hypertension Maternal Grandmother   . Diabetes Maternal Grandmother     EXAM: BP (!) 146/89 (BP Location: Right Arm)   Pulse 100   Temp 98.5 F (36.9 C) (Oral)   Resp 14   SpO2 99%  CONSTITUTIONAL: Alert and oriented and responds appropriately to questions. Well-appearing; well-nourished, afebrile, no distress HEAD: Normocephalic EYES: Conjunctivae clear, pupils appear equal, EOM appear intact ENT: normal nose; moist mucous membranes, no dental injury, small abrasions to her tongue bilaterally without laceration or active bleeding NECK: Supple, normal ROM CARD: Regular and minimally tachycardic; S1 and S2 appreciated; no murmurs, no clicks, no rubs, no gallops RESP: Normal chest excursion without splinting or tachypnea; breath sounds clear and equal bilaterally;  no wheezes, no rhonchi, no rales, no hypoxia or respiratory distress, speaking full sentences ABD/GI: Normal bowel sounds; non-distended; soft, non-tender, no rebound, no guarding, no peritoneal signs, no hepatosplenomegaly BACK:  The back appears normal EXT: Normal ROM in all joints; no deformity noted, no edema; no cyanosis SKIN: Normal color for age and race; warm; no rash on exposed skin NEURO: Moves all  extremities equally, no drift, sensation to light touch intact diffusely, cranial nerves II through XII intact, normal speech PSYCH: The patient's mood and manner are appropriate.   MEDICAL DECISION MAKING: Patient here after generalized tonic-clonic seizures.  She denies any known trigger and reports compliance with her Keppra.  Reports last seizure was 2 years ago.  Denies any infectious symptoms.  No drug or alcohol use.  She did have palpitations without chest pain or shortness of breath as well as a posterior headache before her seizure which is atypical for her.  Will repeat CT of the head today given she is still having some posterior headache although she describes it is currently mild.  We will also check labs, urine.  Blood glucose normal.  Will obtain EKG.  Anticipate if work-up is unremarkable without any reason to explain breakthrough seizure, will discuss with neurology for recommendations.  ED PROGRESS: 3:45 AM  Pt's labs, urine, head CT, EKG showed no significant abnormality.  She is anemic but this appears to be her baseline.  I did discuss the case with Dr. Lorraine Lax on-call for neurology.  Appreciate his help.  He recommends increasing her Keppra to 1000 mg at night.  Discussed with patient and her significant other.  They are comfortable with this plan and will follow up with outpatient neurology.  No further seizure activity here.  Patient resting comfortably without complaints.  We did discuss that patient should not drive for 6 months after most recent seizure and discussed avoiding activities that could be dangerous to herself or others if she were to have another seizure.   At this time, I do not feel there is any life-threatening condition present. I have reviewed, interpreted and discussed all results (EKG, imaging, lab, urine as appropriate) and exam findings with patient/family. I have reviewed nursing notes and appropriate previous records.  I feel the patient is safe to be  discharged home without further emergent workup and can continue workup as an outpatient as needed. Discussed usual and customary return precautions. Patient/family verbalize understanding and are comfortable with this plan.  Outpatient follow-up has been provided as needed. All questions have been answered.     EKG Interpretation  Date/Time:  Saturday July 05 2019 00:59:07 EDT Ventricular Rate:  87 PR Interval:    QRS Duration: 81 QT Interval:  351 QTC Calculation: 423 R Axis:   76 Text Interpretation: Sinus rhythm No old tracing to compare Confirmed by Alijah Akram, Cyril Mourning [phone]) on 07/05/2019 1:33:11 AM          Cheryl [name] was evaluated in Emergency Department on 07/05/2019 for the symptoms described in the history of present illness. She was evaluated in the context of the global COVID-19 pandemic, which necessitated consideration that the patient might be at risk for infection with the SARS-CoV-2 virus that causes COVID-19. Institutional protocols and algorithms that pertain to the evaluation of patients at risk for COVID-19 are in a state of rapid change based on information released by regulatory bodies including the CDC and federal and state organizations. These policies and algorithms were followed during the patient's care in the  ED.  Patient was seen wearing N95, face shield, gloves.    Cheryl [name], Delice Bison, DO 07/05/19 [phone]

## 2019-07-05 NOTE — ED Notes (Signed)
ED Provider at bedside. 

## 2019-07-05 NOTE — ED Triage Notes (Signed)
Pt brought to ED by wife after 2 witnessed seizures at home in bed. Pt recalls laying in bed watching TV then is fuzzy on details, her wife is being called to come back to the ED. Pt does have tongue trauma and is compliant with seizure medication.

## 2019-07-05 NOTE — Discharge Instructions (Addendum)
Please increase your Keppra nighttime dose.  Recommend that you take Keppra 500 mg in the morning and 1000 mg at night.  Please follow-up closely with your neurologist.  Your labs, urine, head CT, EKG showed no acute changes today.  Your hemoglobin was 8.2 today which is stable.  This is likely from your heavy menstrual cycles and due to uterine fibroids.  Please follow-up with your gynecologist as scheduled.  Please continue your iron tablets.

## 2019-07-05 NOTE — ED Notes (Signed)
Patient verbalized understanding of dc instructions, vss, ambulatory with nad.   

## 2019-07-07 LAB — LEVETIRACETAM LEVEL: Levetiracetam Lvl: 5.3 ug/mL — ABNORMAL LOW (ref 10.0–40.0)

## 2019-07-21 ENCOUNTER — Other Ambulatory Visit: Payer: Self-pay | Admitting: Obstetrics & Gynecology

## 2019-07-21 DIAGNOSIS — D259 Leiomyoma of uterus, unspecified: Secondary | ICD-10-CM

## 2019-08-08 ENCOUNTER — Other Ambulatory Visit: Payer: Self-pay | Admitting: Obstetrics & Gynecology

## 2019-08-22 ENCOUNTER — Other Ambulatory Visit: Payer: 59

## 2019-09-22 ENCOUNTER — Encounter: Payer: Self-pay | Admitting: Neurology

## 2019-09-22 ENCOUNTER — Other Ambulatory Visit: Payer: Self-pay

## 2019-09-22 ENCOUNTER — Telehealth (INDEPENDENT_AMBULATORY_CARE_PROVIDER_SITE_OTHER): Payer: 59 | Admitting: Neurology

## 2019-09-22 DIAGNOSIS — G40309 Generalized idiopathic epilepsy and epileptic syndromes, not intractable, without status epilepticus: Secondary | ICD-10-CM | POA: Diagnosis not present

## 2019-09-22 MED ORDER — LEVETIRACETAM 250 MG PO TABS: ORAL_TABLET | ORAL | 11 refills | Status: DC

## 2019-09-22 NOTE — Progress Notes (Signed)
Virtual Visit via Video Note The purpose of this virtual visit is to provide medical care while limiting exposure to the novel coronavirus.    Consent was obtained for video visit:  Yes.   Answered questions that patient had about telehealth interaction:  Yes.   I discussed the limitations, risks, security and privacy concerns of performing an evaluation and management service by telemedicine. I also discussed with the patient that there may be a patient responsible charge related to this service. The patient expressed understanding and agreed to proceed.  Pt location: Home Physician Location: office Name of referring provider:  Forrest Moron, MD I connected with Cheryl [name] at patients initiation/request on 09/22/2019 at 11:30 AM EDT by video enabled telemedicine application and verified that I am speaking with the correct person using two identifiers. Pt MRN:  [id number] Pt DOB:  [birth year] Video Participants:  Cheryl [name]   History of Present Illness:  The patient was seen as a virtual video visit on 09/22/2019. She was last seen in the neurology clinic 6 months ago for seizures. She is on Levetiracetam 250mg  2 tabs in AM, 3 tabs in PM (500mg , 750mg ) without side effects. Since her last visit, she was in the ER on 07/04/19 after she had 2 GTCs back to back, each lasting 45-50 seconds. Her wife witnessed the seizures, she became cyanotic with one of them. At that time, her wife had Covid and was quarantining at home. [name] started having "allergy-type" symptoms with watery eyes, sinus messing up. The back of her head would get very hot with throbbing. She went to bed earlier and increased fluid intake, then had the seizures. She states she was diagnosed with Covid, and Keppra dose was briefly increased to 500mg  in AM, 1000mg  in PM. She took this for a time, then went back to Levetiracetam 250mg  2 tabs in AM, 3 tabs in PM with no further seizures since March. She denies any staring/unresponsive  episodes, gaps in time, olfactory/gustatory hallucinations, focal numbness/tingling/weakness, myoclonic jerks. No headaches, dizziness, no falls. Sleep is okay, mood is doing well. No side effects on current dose of LEV. She is not driving.   Laboratory Data: Keppra level 07/05/19: 5.3  History on Initial Assessment 03/11/2019: This is a pleasant 24 year old right-handed woman presenting to establish adult epilepsy care. Records from her pediatric neurologist were reviewed and will be summarized below. Seizures started at age 47. She recalls the first seizure, she was not feeling well and took medication with soda on an empty stomach. She had been holding her baby cousin then woke up on the floor. Her aunt told her she had a seizure with "light shaking." She saw pediatric neurologist Dr. [name]. Per notes, MRI brain in February 2008 was normal. She had an EEG in February 2008 and July 2011 which were normal. She was thought to have a generalized seizure disorder, then had focal seizures involving twitching of her eyes and gazing at the ceiling. She was loaded with Dilantin at that time and switched to Depakote. She was later changed to Levetiracetam due to continued seizures. She has had good seizure control when she is compliant with Levetiracetam. She did well for several years and medication taper was attempted in 2012, but after being off medication for one day, she had a seizure in 12/2010. She had a seizure in 06/2017 after missing 3 or 4 doses. She had 2 seizures in one day last 10/31/2017 witnessed by her wife that occurred in the  setting of sleep deprivation. She believes this was her last seizure. She has occasional auras where her hands will get clammy and she spaces out "just a tiny bit," something feels off for a second and her equilibrium starts to sway. She denies any recent staring/unresponsive episodes. She is usually lethargic and sleepy after a seizure. No nocturnal seizures. She denies any  olfactory/gustatory hallucinations, focal numbness/tingling/weakness, myoclonic jerks. She has been on Levetiracetam 250mg  2 tabs in AM, 3 tabs in PM with no side effects. She feels it also equalizes her mood. She has noticed stress can also contribute to seizures. She is now in a stress-free environment and has missed doses with no seizure activity. Mood and sleep are good. She denies any headaches, dizziness, vision changes, neck/back pain, bowel/bladder dysfunction. No falls. She works in an [employer]. She is hoping for pregnancy within the next 2 years.   Epilepsy Risk Factors:  She had a normal birth and early development.  There is no history of febrile convulsions, CNS infections such as meningitis/encephalitis, significant traumatic brain injury, neurosurgical procedures, or family history of seizures.   Current Outpatient Medications on File Prior to Visit  Medication Sig Dispense Refill  . levETIRAcetam (KEPPRA) 250 MG tablet TAKE 2 TABLETS BY MOUTH EVERY MORNING AND 3 TABLETS EVERY NIGHT AT BEDTIME     No current facility-administered medications on file prior to visit.     Observations/Objective:   GEN:  The patient appears stated age and is in NAD.  Neurological examination: Patient is awake, alert, oriented x 3. No aphasia or dysarthria. Intact fluency and comprehension. Remote and recent memory intact. Cranial nerves: Extraocular movements intact with no nystagmus. No facial asymmetry. Motor: moves all extremities symmetrically, at least anti-gravity x 4. No incoordination on finger to nose testing. Gait: narrow-based and steady, able to tandem walk adequately.    Assessment and Plan:   This is a pleasant 24 yo RH woman with a history of seizures since age 75 suggestive of primary generalized epilepsy. MRI brain unremarkable, EEG x 2 normal. She had been seizure-free for almost 2 years then had 2 GTCs in one day last 07/04/19 in the setting of a viral infection (she states she  was diagnosed with Covid). She is back to taking Levetiracetam 250mg  2 tabs in AM, 3 tabs in PM with no side effects. We discussed low threshold to increase dose if seizures continue without any clear triggers. We again discussed [place] driving laws to stop driving until 6 months seizure-free. Follow-up in 6 months, she knows to call for any changes.    Follow Up Instructions:    -I discussed the assessment and treatment plan with the patient. The patient was provided an opportunity to ask questions and all were answered. The patient agreed with the plan and demonstrated an understanding of the instructions.   The patient was advised to call back or seek an in-person evaluation if the symptoms worsen or if the condition fails to improve as anticipated.     Cameron Sprang, MD

## 2019-10-23 ENCOUNTER — Telehealth: Payer: 59 | Admitting: Neurology

## 2020-04-21 ENCOUNTER — Other Ambulatory Visit: Payer: Self-pay

## 2020-04-21 ENCOUNTER — Encounter: Payer: Self-pay | Admitting: Neurology

## 2020-04-21 ENCOUNTER — Telehealth (INDEPENDENT_AMBULATORY_CARE_PROVIDER_SITE_OTHER): Payer: 59 | Admitting: Neurology

## 2020-04-21 VITALS — Ht 60.0 in | Wt 115.0 lb

## 2020-04-21 DIAGNOSIS — G40309 Generalized idiopathic epilepsy and epileptic syndromes, not intractable, without status epilepticus: Secondary | ICD-10-CM

## 2020-04-21 MED ORDER — LEVETIRACETAM 250 MG PO TABS: ORAL_TABLET | ORAL | 11 refills | Status: DC

## 2020-04-21 NOTE — Progress Notes (Signed)
Virtual Visit via Video Note The purpose of this virtual visit is to provide medical care while limiting exposure to the novel coronavirus.    Consent was obtained for video visit:  Yes.   Answered questions that patient had about telehealth interaction:  Yes.   I discussed the limitations, risks, security and privacy concerns of performing an evaluation and management service by telemedicine. I also discussed with the patient that there may be a patient responsible charge related to this service. The patient expressed understanding and agreed to proceed.  Pt location: Patient's private vehicle Physician Location: office Name of referring provider:  Forrest Moron, MD I connected with [name] at patients initiation/request on 04/21/2020 at  8:30 AM EST by video enabled telemedicine application and verified that I am speaking with the correct person using two identifiers. Pt MRN:  [id number] Pt DOB:  11/03/1995 Video Participants:  [name]   History of Present Illness:  The patient had a virtual video visit on 04/21/2020. She was last seen in the neurology clinic 7 months ago for seizures. Since her last visit, she is happy to report that she has been seizure-free since March 2021. At that time, she was diagnosed with COVID. She is doing well on Levetiracetam 250mg  2 tabs in AM, 3 tabs in PM without side effects. She denies any staring/unresponsive episodes, gaps in time, olfactory/gustatory hallucinations, focal numbness/tingling/weakness. She has occasional body jerks usually after physical activity. She denies any headaches, dizziness, vision changes, no falls. Sleep is good. Mood is pretty good. She will be having surgery for fibroids at some point.    History on Initial Assessment 03/11/2019: This is a pleasant 25 year old right-handed woman presenting to establish adult epilepsy care. Records from her pediatric neurologist were reviewed and will be summarized below. Seizures started  at age 21. She recalls the first seizure, she was not feeling well and took medication with soda on an empty stomach. She had been holding her baby cousin then woke up on the floor. Her aunt told her she had a seizure with "light shaking." She saw pediatric neurologist Dr. [name]. Per notes, MRI brain in February 2008 was normal. She had an EEG in February 2008 and July 2011 which were normal. She was thought to have a generalized seizure disorder, then had focal seizures involving twitching of her eyes and gazing at the ceiling. She was loaded with Dilantin at that time and switched to Depakote. She was later changed to Levetiracetam due to continued seizures. She has had good seizure control when she is compliant with Levetiracetam. She did well for several years and medication taper was attempted in 2012, but after being off medication for one day, she had a seizure in 12/2010. She had a seizure in 06/2017 after missing 3 or 4 doses. She had 2 seizures in one day last 10/31/2017 witnessed by her wife that occurred in the setting of sleep deprivation. She believes this was her last seizure. She has occasional auras where her hands will get clammy and she spaces out "just a tiny bit," something feels off for a second and her equilibrium starts to sway. She denies any recent staring/unresponsive episodes. She is usually lethargic and sleepy after a seizure. No nocturnal seizures. She denies any olfactory/gustatory hallucinations, focal numbness/tingling/weakness, myoclonic jerks. She has been on Levetiracetam 250mg  2 tabs in AM, 3 tabs in PM with no side effects. She feels it also equalizes her mood. She has noticed stress can also  contribute to seizures. She is now in a stress-free environment and has missed doses with no seizure activity. Mood and sleep are good. She denies any headaches, dizziness, vision changes, neck/back pain, bowel/bladder dysfunction. No falls. She works in an [employer]. She is hoping  for pregnancy within the next 2 years.   Epilepsy Risk Factors:  She had a normal birth and early development.  There is no history of febrile convulsions, CNS infections such as meningitis/encephalitis, significant traumatic brain injury, neurosurgical procedures, or family history of seizures.  Laboratory Data: Keppra level 07/05/19: 5.3    Current Outpatient Medications on File Prior to Visit  Medication Sig Dispense Refill  . levETIRAcetam (KEPPRA) 250 MG tablet TAKE 2 TABLETS BY MOUTH EVERY MORNING AND 3 TABLETS EVERY NIGHT AT BEDTIME 150 tablet 11   No current facility-administered medications on file prior to visit.     Observations/Objective:   GEN:  The patient appears stated age and is in NAD.  Neurological examination: Patient is awake, alert. No aphasia or dysarthria. Intact fluency and comprehension. Cranial nerves: Extraocular movements intact with no nystagmus. No facial asymmetry. Motor: moves all extremities symmetrically, at least anti-gravity x 4.    Assessment and Plan:   This is a pleasant 25 yo RH woman with a history of seizures since age 64 suggestive of primary generalized epilepsy. MRI brain unremarkable, EEG x 2 normal. She had been seizure-free for almost 2 years when she had 2 GTCs in one day in March 2021 in the setting of a viral infection (COVID). She denies any further seizures since, no side effects on Levetiracetam 250mg  2 tabs in AM, 3 tabs in PM. We again discussed avoidance of seizure triggers. She is aware of [place] driving laws to stop driving after a seizure until 6 months seizure-free. Follow-up in 8 months, she knows to call for any changes.    Follow Up Instructions:   -I discussed the assessment and treatment plan with the patient. The patient was provided an opportunity to ask questions and all were answered. The patient agreed with the plan and demonstrated an understanding of the instructions.   The patient was advised to call back or seek an  in-person evaluation if the symptoms worsen or if the condition fails to improve as anticipated.     Cameron Sprang, MD

## 2020-07-15 IMAGING — CT CT HEAD W/O CM
4 series · 16 of 47 positions shown, 18 images · non-contrast
Comparison: None.

CLINICAL DATA: Headache and seizures

EXAM:
CT HEAD WITHOUT CONTRAST
TECHNIQUE: Contiguous axial images were obtained from the base of the skull
through the vertex without intravenous contrast.

[Series 3: head without · axial · non-contrast · 0.39mm/px · z∈[-90,+25]mm · 7 of 31 slices shown, 9 images]
[im 4/31  brain]
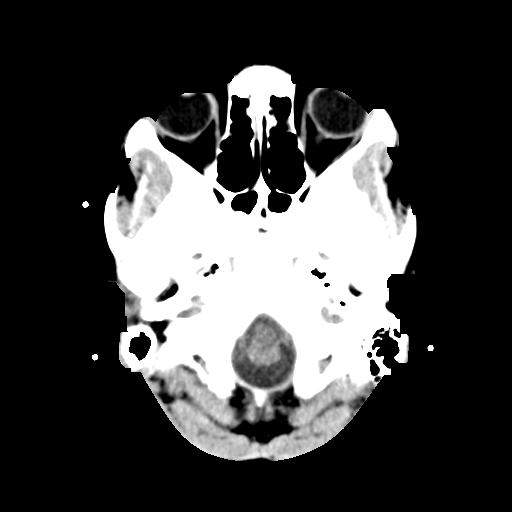
[im 4/31  bone]
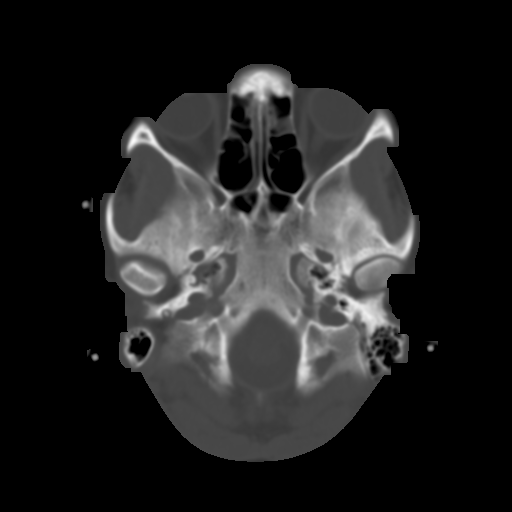
[im 8/31  brain]
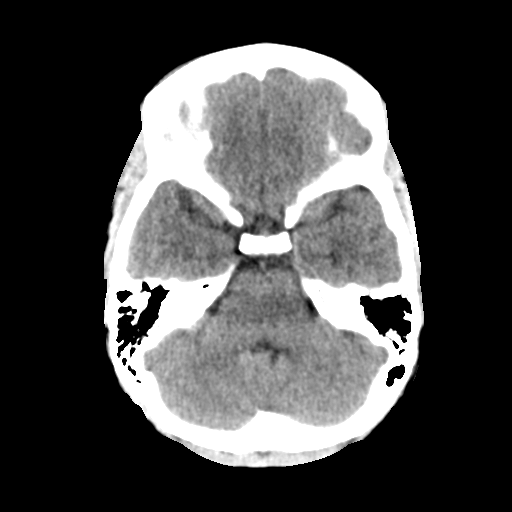
[im 12/31  brain]
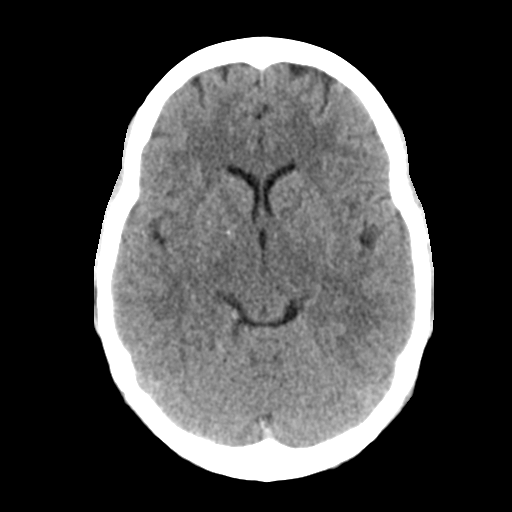
[im 16/31  brain]
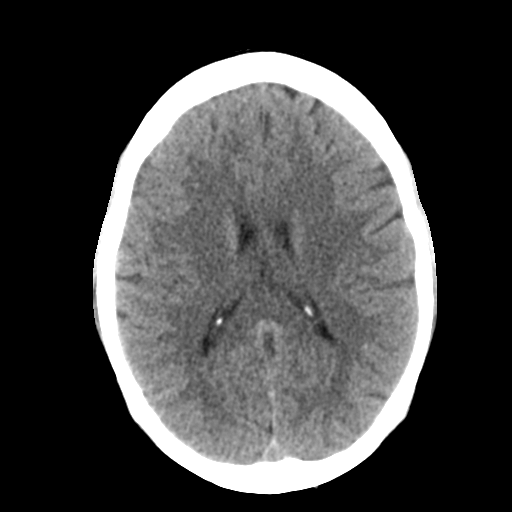
[im 19/31  brain]
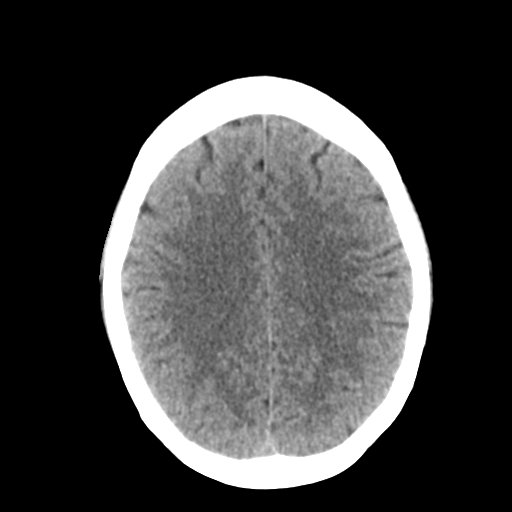
[im 19/31  bone]
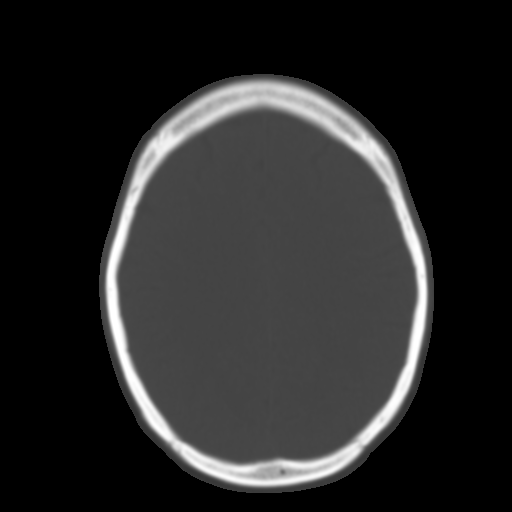
[im 23/31  brain]
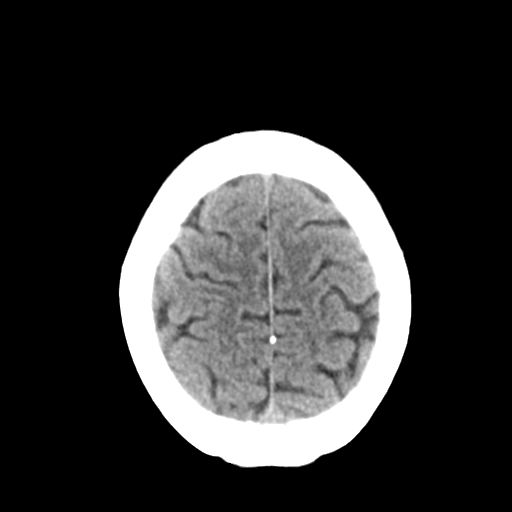
[im 27/31  brain]
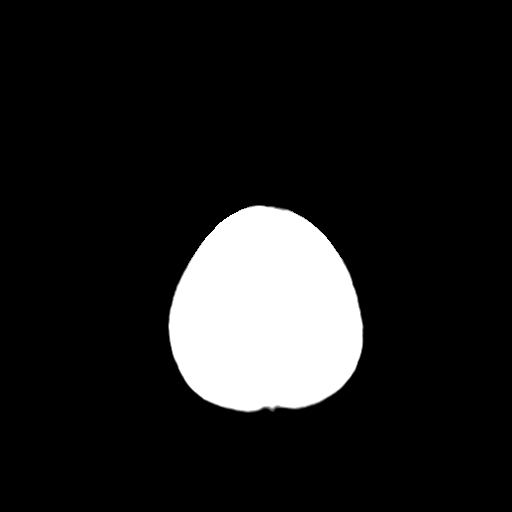

[Series 4: head bone · axial · 0.39mm/px · z∈[-91,-61]mm · 3 of 77 slices shown]
[im 8/77  bone]
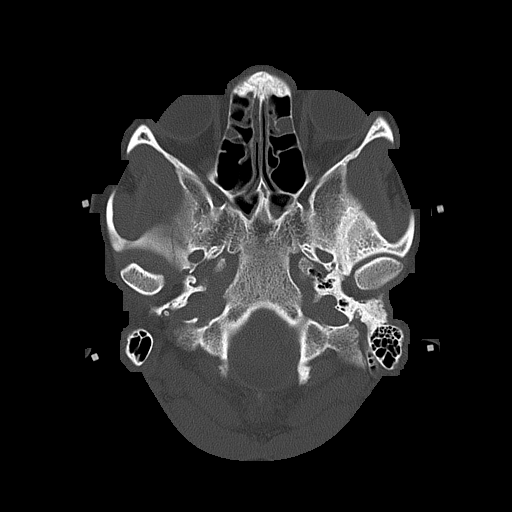
[im 16/77  bone]
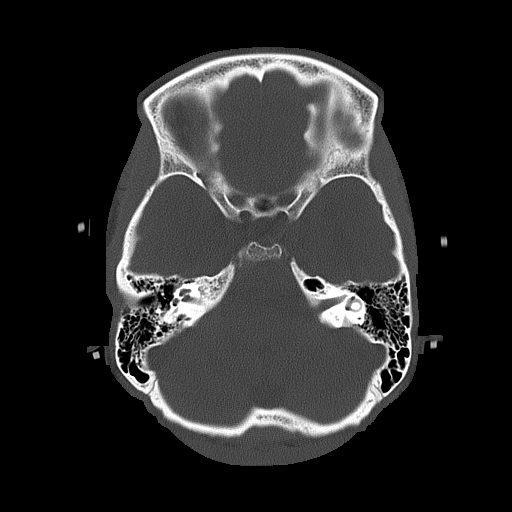
[im 23/77  bone]
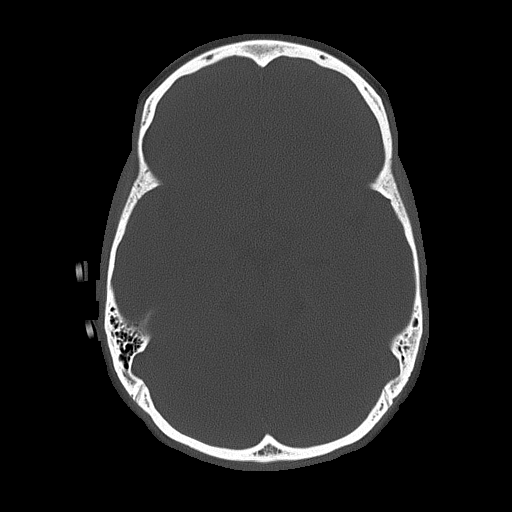

[Series 5: head without cor · coronal · non-contrast · 0.44mm/px · 3 of 67 slices shown]
[im 23/67  brain]
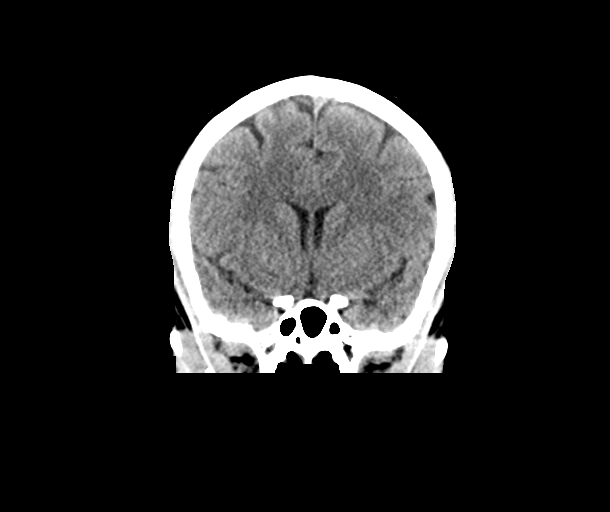
[im 30/67  brain]
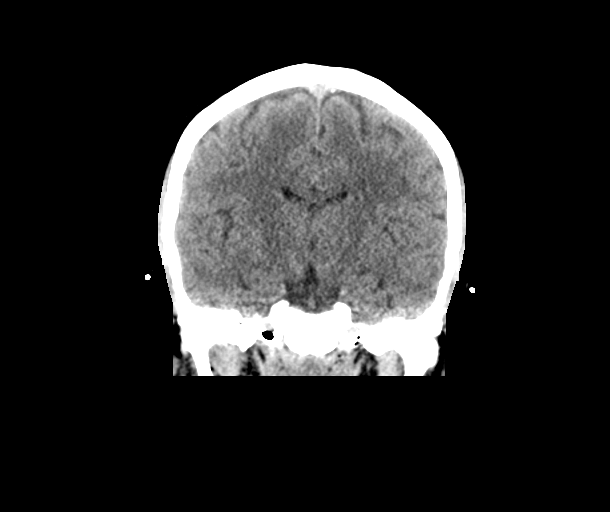
[im 37/67  brain]
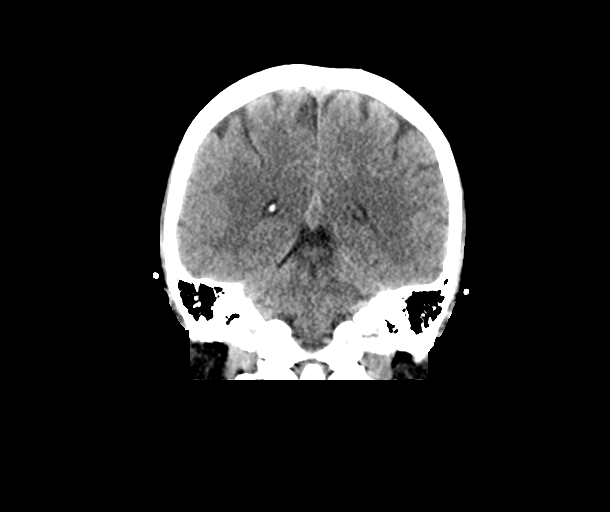

[Series 6: head without sag · sagittal · non-contrast · 0.33mm/px · 3 of 67 slices shown]
[im 23/67  brain]
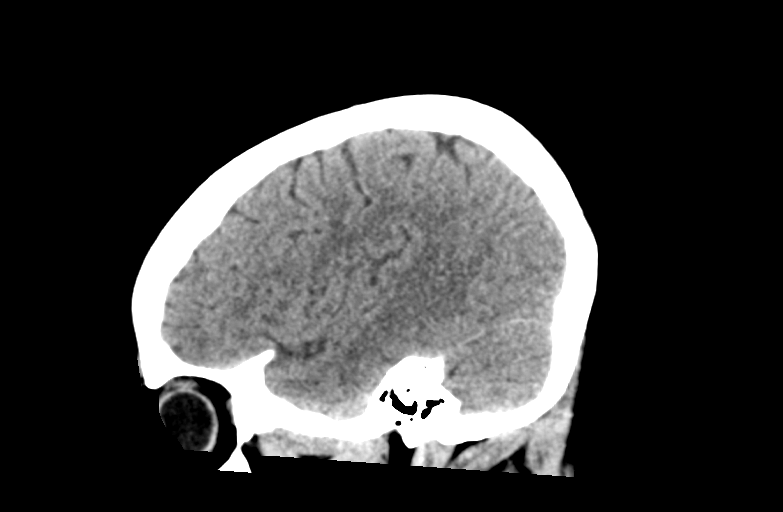
[im 34/67  brain]
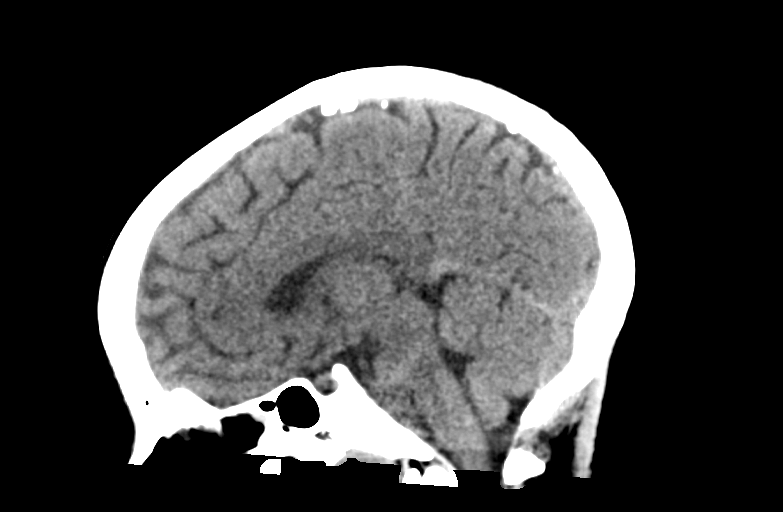
[im 45/67  brain]
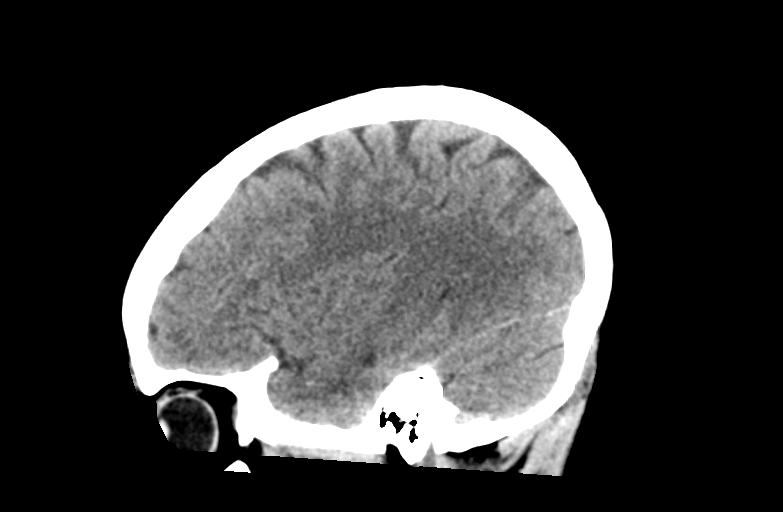

[16 of 47 positions shown; findings below may reference images not displayed]

FINDINGS: Brain: No evidence of acute territorial infarction, hemorrhage,
hydrocephalus,extra-axial collection or mass lesion/mass effect.
Normal gray-white differentiation. Ventricles are normal in size and
contour.

Vascular: No hyperdense vessel or unexpected calcification.

Skull: The skull is intact. No fracture or focal lesion identified.

Sinuses/Orbits: Ethmoid air cell mucosal thickening is seen. Small
amount of fluid in the left maxillary sinus. The orbits and globes
intact.

Other: None
IMPRESSION: No acute intracranial abnormality.

## 2020-08-15 ENCOUNTER — Encounter (INDEPENDENT_AMBULATORY_CARE_PROVIDER_SITE_OTHER): Payer: Self-pay

## 2020-12-24 ENCOUNTER — Telehealth (INDEPENDENT_AMBULATORY_CARE_PROVIDER_SITE_OTHER): Payer: BLUE CROSS/BLUE SHIELD | Admitting: Neurology

## 2020-12-24 ENCOUNTER — Encounter: Payer: Self-pay | Admitting: Neurology

## 2020-12-24 ENCOUNTER — Other Ambulatory Visit: Payer: Self-pay

## 2020-12-24 VITALS — Ht 60.0 in | Wt 110.0 lb

## 2020-12-24 DIAGNOSIS — G40309 Generalized idiopathic epilepsy and epileptic syndromes, not intractable, without status epilepticus: Secondary | ICD-10-CM | POA: Diagnosis not present

## 2020-12-24 MED ORDER — LEVETIRACETAM 250 MG PO TABS: ORAL_TABLET | ORAL | 11 refills | Status: DC

## 2020-12-24 NOTE — Progress Notes (Signed)
Virtual Visit via Video Note The purpose of this virtual visit is to provide medical care while limiting exposure to the novel coronavirus.    Consent was obtained for video visit:  Yes.   Answered questions that patient had about telehealth interaction:  Yes.   I discussed the limitations, risks, security and privacy concerns of performing an evaluation and management service by telemedicine. I also discussed with the patient that there may be a patient responsible charge related to this service. The patient expressed understanding and agreed to proceed.  Pt location: Home Physician Location: office Name of referring provider:  Forrest Moron, MD I connected with Cheryl [name] at patients initiation/request on 12/24/2020 at  8:30 AM EDT by video enabled telemedicine application and verified that I am speaking with the correct person using two identifiers. Pt MRN:  [id number] Pt DOB:  [birth year] Video Participants:  Cheryl [name]   History of Present Illness:  The patient had a virtual video visit on 12/24/2020. She was last seen 8 months ago for primary generalized epilepsy. She continues to do well seizure-free since March 2021 (in setting of COVID), on Levetiracetam '250mg'$  2 tabs in AM, 3 tabs in PM. She now lives with her mother and is in Art gallery manager school. She denies any staring/unresponsive episodes, spacing out, gaps in time, olfactory/gustatory hallucinations, focal numbness/tingling/weakness, myoclonic jerks. She denies any headaches, dizziness, vision changes, no falls. Sleep is good. Mood is better.    History on Initial Assessment 03/11/2019: This is a pleasant 25 year old right-handed woman presenting to establish adult epilepsy care. Records from her pediatric neurologist were reviewed and will be summarized below. Seizures started at age 46. She recalls the first seizure, she was not feeling well and took medication with soda on an empty stomach. She had been holding her baby cousin then  woke up on the floor. Her aunt told her she had a seizure with "light shaking." She saw pediatric neurologist Dr. [name]. Per notes, MRI brain in February 2008 was normal. She had an EEG in February 2008 and July 2011 which were normal. She was thought to have a generalized seizure disorder, then had focal seizures involving twitching of her eyes and gazing at the ceiling. She was loaded with Dilantin at that time and switched to Depakote. She was later changed to Levetiracetam due to continued seizures. She has had good seizure control when she is compliant with Levetiracetam. She did well for several years and medication taper was attempted in 2012, but after being off medication for one day, she had a seizure in 12/2010. She had a seizure in 06/2017 after missing 3 or 4 doses. She had 2 seizures in one day last 10/31/2017 witnessed by her wife that occurred in the setting of sleep deprivation. She believes this was her last seizure. She has occasional auras where her hands will get clammy and she spaces out "just a tiny bit," something feels off for a second and her equilibrium starts to sway. She denies any recent staring/unresponsive episodes. She is usually lethargic and sleepy after a seizure. No nocturnal seizures. She denies any olfactory/gustatory hallucinations, focal numbness/tingling/weakness, myoclonic jerks. She has been on Levetiracetam '250mg'$  2 tabs in AM, 3 tabs in PM with no side effects. She feels it also equalizes her mood. She has noticed stress can also contribute to seizures. She is now in a stress-free environment and has missed doses with no seizure activity. Mood and sleep are good. She denies any headaches, dizziness,  vision changes, neck/back pain, bowel/bladder dysfunction. No falls. She works in an [employer]. She is hoping for pregnancy within the next 2 years.   Epilepsy Risk Factors:  She had a normal birth and early development.  There is no history of febrile convulsions, CNS  infections such as meningitis/encephalitis, significant traumatic brain injury, neurosurgical procedures, or family history of seizures.  Laboratory Data: Keppra level 07/05/19: 5.3   Current Outpatient Medications on File Prior to Visit  Medication Sig Dispense Refill   levETIRAcetam (KEPPRA) 250 MG tablet TAKE 2 TABLETS BY MOUTH EVERY MORNING AND 3 TABLETS EVERY NIGHT AT BEDTIME 150 tablet 11   No current facility-administered medications on file prior to visit.     Observations/Objective:   Vitals:   12/24/20 0819  Weight: 110 lb (49.9 kg)  Height: 5' (1.524 m)   GEN:  The patient appears stated age and is in NAD.  Neurological examination: Patient is awake, alert. No aphasia or dysarthria. Intact fluency and comprehension. Remote and recent memory intact.Cranial nerves: Extraocular movements intact with no nystagmus. No facial asymmetry. Motor: moves all extremities symmetrically, at least anti-gravity x 4.    Assessment and Plan:   This is a pleasant 25 yo RH woman with a history of seizures since age 76 suggestive of primary generalized epilepsy. MRI brain unremarkable, EEG x 2 normal. She continues to do well seizure-free since March 2021 (2 GTCs in one day in setting of COVID infection). No side effects on Levetiracetam '250mg'$  2 tabs in AM, 3 tabs in PM, refills sent. She is aware of [place] driving laws to stop driving after a seizure until 6 months seizure-free. Follow-up in 1 year, call for any changes.    Follow Up Instructions:   -I discussed the assessment and treatment plan with the patient. The patient was provided an opportunity to ask questions and all were answered. The patient agreed with the plan and demonstrated an understanding of the instructions.   The patient was advised to call back or seek an in-person evaluation if the symptoms worsen or if the condition fails to improve as anticipated.     Cameron Sprang, MD

## 2020-12-24 NOTE — Patient Instructions (Signed)
Always good to see you! Refills have been sent for Levetiracetam (Keppra) '250mg'$ : take 2 tabs in AM, 3 tabs in PM. Let me know if you need anything for your future plans. Follow-up in 1 year, call for any changes.    Seizure Precautions: 1. If medication has been prescribed for you to prevent seizures, take it exactly as directed.  Do not stop taking the medicine without talking to your doctor first, even if you have not had a seizure in a long time.   2. Avoid activities in which a seizure would cause danger to yourself or to others.  Don't operate dangerous machinery, swim alone, or climb in high or dangerous places, such as on ladders, roofs, or girders.  Do not drive unless your doctor says you may.  3. If you have any warning that you may have a seizure, lay down in a safe place where you can't hurt yourself.    4.  No driving for 6 months from last seizure, as per [facility].   Please refer to the following link on the [place] website for more information: http://www.epilepsyfoundation.org/answerplace/Social/driving/drivingu.cfm   5.  Maintain good sleep hygiene. Avoid alcohol.  6.  Notify your neurology if you are planning pregnancy or if you become pregnant.  7.  Contact your doctor if you have any problems that may be related to the medicine you are taking.  8.  Call 911 and bring the patient back to the ED if:        A.  The seizure lasts longer than 5 minutes.       B.  The patient doesn't awaken shortly after the seizure  C.  The patient has new problems such as difficulty seeing, speaking or moving  D.  The patient was injured during the seizure  E.  The patient has a temperature over 102 F (39C)  F.  The patient vomited and now is having trouble breathing

## 2021-02-17 ENCOUNTER — Encounter: Payer: Self-pay | Admitting: Neurology

## 2021-12-26 ENCOUNTER — Ambulatory Visit: Payer: BLUE CROSS/BLUE SHIELD | Admitting: Neurology

## 2022-01-02 ENCOUNTER — Ambulatory Visit (INDEPENDENT_AMBULATORY_CARE_PROVIDER_SITE_OTHER): Payer: BC Managed Care – PPO | Admitting: Neurology

## 2022-01-02 ENCOUNTER — Encounter: Payer: Self-pay | Admitting: Neurology

## 2022-01-02 VITALS — BP 122/72 | HR 91 | Ht 62.0 in | Wt 113.8 lb

## 2022-01-02 DIAGNOSIS — G40309 Generalized idiopathic epilepsy and epileptic syndromes, not intractable, without status epilepticus: Secondary | ICD-10-CM

## 2022-01-02 MED ORDER — LEVETIRACETAM 250 MG PO TABS: ORAL_TABLET | ORAL | 3 refills | Status: DC

## 2022-01-02 NOTE — Patient Instructions (Signed)
Always good to see you. Continue Keppra '250mg'$ : take 2 tablets in AM, 3 tablets in PM. Let me know if any issues with the prescription. Follow-up in 1 year, call for any changes.    Seizure Precautions: 1. If medication has been prescribed for you to prevent seizures, take it exactly as directed.  Do not stop taking the medicine without talking to your doctor first, even if you have not had a seizure in a long time.   2. Avoid activities in which a seizure would cause danger to yourself or to others.  Don't operate dangerous machinery, swim alone, or climb in high or dangerous places, such as on ladders, roofs, or girders.  Do not drive unless your doctor says you may.  3. If you have any warning that you may have a seizure, lay down in a safe place where you can't hurt yourself.    4.  No driving for 6 months from last seizure, as per [facility].   Please refer to the following link on the [place] website for more information: http://www.epilepsyfoundation.org/answerplace/Social/driving/drivingu.cfm   5.  Maintain good sleep hygiene. Avoid alcohol.  6.  Notify your neurology if you are planning pregnancy or if you become pregnant.  7.  Contact your doctor if you have any problems that may be related to the medicine you are taking.  8.  Call 911 and bring the patient back to the ED if:        A.  The seizure lasts longer than 5 minutes.       B.  The patient doesn't awaken shortly after the seizure  C.  The patient has new problems such as difficulty seeing, speaking or moving  D.  The patient was injured during the seizure  E.  The patient has a temperature over 102 F (39C)  F.  The patient vomited and now is having trouble breathing

## 2022-01-02 NOTE — Progress Notes (Signed)
NEUROLOGY FOLLOW UP OFFICE NOTE  Cheryl [name] [id number] 04-07-1996  HISTORY OF PRESENT ILLNESS: I had the pleasure of seeing Cheryl [name] in follow-up in the neurology clinic on 01/02/2022.  The patient was last seen a year ago for primary generalized epilepsy. She is alone in the office today. Records and images were personally reviewed where available.  Since her last visit, she continues to do well seizure-free since March 2021 (seizures in the setting of Covid infection), on Levetiracetam '250mg'$  2 tabs in AM, 3 tabs in PM without side effects. She denies any staring/unresponsive episodes, spacing out, gaps in time, olfactory/gustatory hallucinations, focal numbness/tingling/weakness, myoclonic jerks. No headaches, dizziness, vision changes, no falls. 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 Mood is good. She still has her mother's address as her home address but may be moving.   History on Initial Assessment 03/11/2019: This is a pleasant 26 year old right-handed woman presenting to establish adult epilepsy care. Records from her pediatric neurologist were reviewed and will be summarized below. Seizures started at age 26. She recalls the first seizure, she was not feeling well and took medication with soda on an empty stomach. She had been holding her baby cousin then woke up on the floor. Her aunt told her she had a seizure with "light shaking." She saw pediatric neurologist Dr. [name]. Per notes, MRI brain in February 2008 was normal. She had an EEG in February 2008 and July 2011 which were normal. She was thought to have a generalized seizure disorder, then had focal seizures involving twitching of her eyes and gazing at the ceiling. She was loaded with Dilantin at that time and switched to Depakote. She was later changed to Levetiracetam due to continued seizures. She has had good seizure control when she is compliant with Levetiracetam. She did well for several years  and medication taper was attempted in 2012, but after being off medication for one day, she had a seizure in 12/2010. She had a seizure in 06/2017 after missing 3 or 4 doses. She had 2 seizures in one day last 10/31/2017 witnessed by her wife that occurred in the setting of sleep deprivation. She believes this was her last seizure. She has occasional auras where her hands will get clammy and she spaces out "just a tiny bit," something feels off for a second and her equilibrium starts to sway. She denies any recent staring/unresponsive episodes. She is usually lethargic and sleepy after a seizure. No nocturnal seizures. She denies any olfactory/gustatory hallucinations, focal numbness/tingling/weakness, myoclonic jerks. She has been on Levetiracetam '250mg'$  2 tabs in AM, 3 tabs in PM with no side effects. She feels it also equalizes her mood. She has noticed stress can also contribute to seizures. She is now in a stress-free environment and has missed doses with no seizure activity. Mood and sleep are good. She denies any headaches, dizziness, vision changes, neck/back pain, bowel/bladder dysfunction. No falls. She works in an [employer]. She is hoping for pregnancy within the next 2 years.   Epilepsy Risk Factors:  She had a normal birth and early development.  There is no history of febrile convulsions, CNS infections such as meningitis/encephalitis, significant traumatic brain injury, neurosurgical procedures, or family history of seizures.  Laboratory Data: Keppra level 07/05/19: 5.3  PAST MEDICAL HISTORY: Past Medical History:  Diagnosis Date   Fibroids    Seizures ([place])     MEDICATIONS: Current Outpatient Medications on File Prior to Visit  Medication Sig Dispense Refill   levETIRAcetam (KEPPRA) 250 MG tablet TAKE 2 TABLETS BY MOUTH EVERY MORNING AND 3 TABLETS EVERY NIGHT AT BEDTIME 150 tablet 11   No current facility-administered medications on file prior to visit.    ALLERGIES: Allergies   Allergen Reactions   Diastat Acudial [Diazepam] Other (See Comments)    Does not work well for Patient     FAMILY HISTORY: Family History  Problem Relation Age of Onset   Hypertension Maternal Grandmother    Diabetes Maternal Grandmother     SOCIAL HISTORY: Social History   Socioeconomic History   Marital status: Married    Spouse name: Eritrea Bonifield   Number of children: Not on file   Years of education: Not on file   Highest education level: Not on file  Occupational History   Not on file  Tobacco Use   Smoking status: Never   Smokeless tobacco: Never  Vaping Use   Vaping Use: Never used  Substance and Sexual Activity   Alcohol use: Not Currently    Comment: One or twice a month-Socially   Drug use: No   Sexual activity: Yes    Partners: Female  Other Topics Concern   Not on file  Social History Narrative   Alixandrea is currently enrolled at [employer] in her second year of college and works at [employer] 24 hours a week.   Damara lives with her mother and sibling.   Brogan enjoys music, reading, and walking downtown.   Creedence is doing well in school.   Right handed    Social Determinants of Health   Financial Resource Strain: Not on file  Food Insecurity: Not on file  Transportation Needs: Not on file  Physical Activity: Not on file  Stress: Not on file  Social Connections: Not on file  Intimate Partner Violence: Not on file     PHYSICAL EXAM: Vitals:   01/02/22 0801  BP: 122/72  Pulse: 91  SpO2: 100%   General: No acute distress Head:  Normocephalic/atraumatic Skin/Extremities: No rash, no edema Neurological Exam: alert and awake. No aphasia or dysarthria. Fund of knowledge is appropriate.   Attention and concentration are normal.   Cranial nerves: Pupils equal, round. Extraocular movements intact with no nystagmus. Visual fields full.  No facial asymmetry.  Motor: Bulk and tone normal, muscle strength 5/5 throughout with no pronator drift.   Finger to nose testing  intact.  Gait narrow-based and steady, able to tandem walk adequately.  Romberg negative.   IMPRESSION: This is a pleasant 26 yo RH woman with a history of seizures since age 45 suggestive of primary generalized epilepsy. MRI brain unremarkable, EEG x 2 normal. She continues to do well seizure-free since March 2021 (2 GTCs in one day in the setting of Covid infection). Continue Levetiracetam '250mg'$  2 tablets in AM, 3 tablets in PM, refills sent. We discussed avoidance of seizure triggers, including missing medication, sleep deprivation, and alcohol. She is aware of [place] driving laws to stop driving after a seizure until 6 months seizure-free. Follow-up in 1 year, call for any changes.   Thank you for allowing me to participate in her care.  Please do not hesitate to call for any questions or concerns.    Ellouise Newer, M.D.

## 2022-01-19 ENCOUNTER — Other Ambulatory Visit: Payer: Self-pay | Admitting: Neurology

## 2022-01-26 ENCOUNTER — Telehealth: Payer: Self-pay | Admitting: Neurology

## 2022-01-26 MED ORDER — LEVETIRACETAM 250 MG PO TABS: ORAL_TABLET | ORAL | 0 refills | Status: DC

## 2022-01-26 NOTE — Telephone Encounter (Signed)
Refill sent in for pt,  

## 2022-01-26 NOTE — Telephone Encounter (Signed)
Pt needs a new RX sent in for a 90 day supply. Stated her ins approved it, she needs aquino to send it in.  Levetiracetam '250mg'$    [phone] [phone]

## 2022-02-16 ENCOUNTER — Other Ambulatory Visit: Payer: Self-pay | Admitting: Neurology

## 2022-03-06 ENCOUNTER — Telehealth: Payer: Self-pay | Admitting: Neurology

## 2022-03-06 NOTE — Telephone Encounter (Signed)
Pt c/o: seizure Missed medications?  Yes.   Forgot to take it and then went on a trip and didn't take it with her.  Sleep deprived?  No. Alcohol intake?  No. Increased stress? No. Any change in medication color or shape? No. Back to their usual baseline self?  No.. If no, advise go to ER Current medications prescribed by Dr. Delice Lesch:  levetiracetam 250 mg AKE 2 TABLETS BY MOUTH EVERY MORNING AND 3 TABLETS EVERY NIGHT AT BEDTIME   She couldn't find it and then found it and now taken it normal  Pt advised that [place] law she has to be seizure free 6 months to drive but I will send this to Dr Delice Lesch to review,

## 2022-03-06 NOTE — Telephone Encounter (Signed)
Pt called in stating she missed 3 doses of her medication and had a seizure on that third day 03/04/22. She also mentioned her hema globin was low due to her fibroids. She is worried about driving. She is hoping she can still drive since the only reason she had the seizure is due to her not taking her medication.

## 2022-03-06 NOTE — Telephone Encounter (Signed)
Pt called no answer left a voice mail to call the office back  °

## 2022-03-06 NOTE — Telephone Encounter (Signed)
Pls let her know that the doctors are not the police, we can only let her know that [place] driving laws indicate no driving until 6 months seizure-free. I cannot release her to drive though because she had a seizure, it is not in my power. Only the DMV can make that exception for her.

## 2022-03-09 NOTE — Telephone Encounter (Signed)
Pt called an informed that the doctors are not the police, we can only let her know that [place] driving laws indicate no driving until 6 months seizure-free. Dr Delice Lesch  cannot release her to drive though because she had a seizure, it is not in my power. Only the DMV can make that exception for her.

## 2022-04-12 ENCOUNTER — Other Ambulatory Visit: Payer: Self-pay | Admitting: Neurology

## 2022-07-28 ENCOUNTER — Other Ambulatory Visit: Payer: Self-pay | Admitting: Neurology

## 2022-11-15 ENCOUNTER — Telehealth: Payer: Self-pay | Admitting: Neurology

## 2022-11-15 MED ORDER — LEVETIRACETAM 250 MG PO TABS: ORAL_TABLET | ORAL | 0 refills | Status: DC

## 2022-11-15 NOTE — Telephone Encounter (Signed)
Pt is calling in  1) wanting to see if her appointment on 12/27/2022 @ 8:30 can be changed to a virtual visit (Annual visit)  2) to request a refill on her levetiracetam (KEPPRA) 250 MG --CHANGE OF Pharm:  [facility] [address], [place], [place] [address].  Pt stated that she only has enough to carry her through this Saturday.  Pt would like to have a call back once the medication has been called in.

## 2022-11-15 NOTE — Telephone Encounter (Signed)
I've sent in refill to last until her appointment

## 2022-11-16 NOTE — Telephone Encounter (Signed)
Pls let her know refill sent until next appt, however due to office policy that patients need to be seen in person at least once a year, we need to do in-person visit, thanks

## 2022-11-18 ENCOUNTER — Other Ambulatory Visit: Payer: Self-pay | Admitting: Neurology

## 2022-12-15 ENCOUNTER — Telehealth: Payer: Self-pay | Admitting: Neurology

## 2022-12-15 MED ORDER — LEVETIRACETAM 250 MG PO TABS: ORAL_TABLET | ORAL | 0 refills | Status: DC

## 2022-12-15 NOTE — Telephone Encounter (Signed)
1. Which medications need to be refilled? (please list name of each medication and dose if known) levETIRAcetam (KEPPRA) 250 MG tablet [[id number]]   2. Which pharmacy/location (including street and city if local pharmacy) is medication to be sent to? [facility] - [address], [place], [place] [address]  3. Do they need a 30 day or 90 day supply? 30   Caller stated she need an emergency refill. Stated she only has enough to last her until Monday 12/18/22. Caller needs just enough to last until next follow up appointment on 12/27/22.

## 2022-12-15 NOTE — Telephone Encounter (Signed)
Pt called an informed that refills have been sent in to requested pharmacy

## 2022-12-27 ENCOUNTER — Encounter: Payer: Self-pay | Admitting: Neurology

## 2022-12-27 ENCOUNTER — Telehealth: Payer: 59 | Admitting: Neurology

## 2022-12-27 VITALS — Ht 61.0 in | Wt 120.0 lb

## 2022-12-27 DIAGNOSIS — G40309 Generalized idiopathic epilepsy and epileptic syndromes, not intractable, without status epilepticus: Secondary | ICD-10-CM

## 2022-12-27 MED ORDER — LEVETIRACETAM 250 MG PO TABS: ORAL_TABLET | ORAL | 3 refills | Status: DC

## 2022-12-27 NOTE — Progress Notes (Signed)
Virtual Visit via Video Note The purpose of this virtual visit is to provide medical care while limiting exposure to the novel coronavirus.    Consent was obtained for video visit:  Yes.   Answered questions that patient had about telehealth interaction:  Yes.     Pt location: Home Physician Location: office Name of referring provider:  No ref. provider found I connected with Cheryl [name] at patients initiation/request on 12/27/2022 at  8:30 AM EDT by video enabled telemedicine application and verified that I am speaking with the correct person using two identifiers. Pt MRN:  [id number] Pt DOB:  [birth year] Video Participants:  Cheryl [name]   History of Present Illness:  The patient had a virtual video visit on 12/27/2022. She was last seen in the neurology clinic a year ago for Primary Generalized Epilepsy. Since her last visit, she contacted our office about seizures that occurred on 03/04/22. Records reviewed, she was brought to the ER in [place], [place] where she was visiting a friend. She had forgotten her medication and missed 3 doses (2 full days), she had 2 seizures in her friend's car, then another GTC in the ER. She also reported a lot of stress. Bloodwork showed anemia, she was given 2 units of blood. She continues on Levetiracetam 250mg  2 tabs in AM, 3 tabs in PM with no further seizures since 02/2022. She denies any staring/unresponsive episodes, gaps in time, olfactory/gustatory hallucinations, focal numbness/tingling/weakness, myoclonic jerks. No headaches, dizziness, vision changes, no falls. She gets 8 hours of sleep. Mood is good, stable. She has been exercising regularly at a gym. She lives with her partner. 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    History on Initial Assessment 03/11/2019: This is a pleasant 27 year old right-handed woman presenting to establish adult epilepsy care. Records from her pediatric neurologist were reviewed and will be summarized below. Seizures started at age 62.  She recalls the first seizure, she was not feeling well and took medication with soda on an empty stomach. She had been holding her baby cousin then woke up on the floor. Her aunt told her she had a seizure with "light shaking." She saw pediatric neurologist Dr. Sharene Skeans. Per notes, MRI brain in February 2008 was normal. She had an EEG in February 2008 and July 2011 which were normal. She was thought to have a generalized seizure disorder, then had focal seizures involving twitching of her eyes and gazing at the ceiling. She was loaded with Dilantin at that time and switched to Depakote. She was later changed to Levetiracetam due to continued seizures. She has had good seizure control when she is compliant with Levetiracetam. She did well for several years and medication taper was attempted in 2012, but after being off medication for one day, she had a seizure in 12/2010. She had a seizure in 06/2017 after missing 3 or 4 doses. She had 2 seizures in one day last 10/31/2017 witnessed by her wife that occurred in the setting of sleep deprivation. She believes this was her last seizure. She has occasional auras where her hands will get clammy and she spaces out "just a tiny bit," something feels off for a second and her equilibrium starts to sway. She denies any recent staring/unresponsive episodes. She is usually lethargic and sleepy after a seizure. No nocturnal seizures. She denies any olfactory/gustatory hallucinations, focal numbness/tingling/weakness, myoclonic jerks. She has been on Levetiracetam 250mg  2 tabs in AM, 3 tabs in PM with no side effects. She feels it also  equalizes her mood. She has noticed stress can also contribute to seizures. She is now in a stress-free environment and has missed doses with no seizure activity. Mood and sleep are good. She denies any headaches, dizziness, vision changes, neck/back pain, bowel/bladder dysfunction. No falls. She works in an [employer]. She is hoping for  pregnancy within the next 2 years.   Epilepsy Risk Factors:  She had a normal birth and early development.  There is no history of febrile convulsions, CNS infections such as meningitis/encephalitis, significant traumatic brain injury, neurosurgical procedures, or family history of seizures.  Laboratory Data: Keppra level 07/05/19: 5.3   Current Outpatient Medications on File Prior to Visit  Medication Sig Dispense Refill   levETIRAcetam (KEPPRA) 250 MG tablet TAKE 2 TABLETS EVERY MORNING AND 3 TABLETS EVERY NIGHT AT BEDTIME 150 tablet 0   No current facility-administered medications on file prior to visit.     Observations/Objective:   Vitals:   12/27/22 0759  Weight: 120 lb (54.4 kg)  Height: 5\' 1"  (1.549 m)   GEN:  The patient appears stated age and is in NAD.  Neurological examination: Patient is awake, alert. No aphasia or dysarthria. Intact fluency and comprehension. Cranial nerves: Extraocular movements intact with no nystagmus. No facial asymmetry. Motor: moves all extremities symmetrically, at least anti-gravity x 4.    Assessment and Plan:   This is a pleasant 27 yo RH woman with a history of seizures since age 31 suggestive of primary generalized epilepsy. MRI brain unremarkable, EEG x 2 normal. She had 3 seizures in one day last 03/04/22 in the setting of missed medication. Continue Levetiracetam 250mg  2 tabs in AM, 3 tabs in PM. We again discussed avoidance of seizure triggers. No pregnancy plans. She is aware of  driving laws to stop driving after a seizure until 6 months seizure-free. Follow-up in 1 year, call for any changes.    Follow Up Instructions:  -I discussed the assessment and treatment plan with the patient. The patient was provided an opportunity to ask questions and all were answered. The patient agreed with the plan and demonstrated an understanding of the instructions.   The patient was advised to call back or seek an in-person evaluation if the symptoms  worsen or if the condition fails to improve as anticipated.    Van Clines, MD

## 2022-12-27 NOTE — Patient Instructions (Signed)
Good to see you doing well. Continue Levetiracetam (Keppra) 250mg : take 2 tablets in AM, 3 tablets in PM. Follow-up in 1 year, call for any changes.    Seizure Precautions: 1. If medication has been prescribed for you to prevent seizures, take it exactly as directed.  Do not stop taking the medicine without talking to your doctor first, even if you have not had a seizure in a long time.   2. Avoid activities in which a seizure would cause danger to yourself or to others.  Don't operate dangerous machinery, swim alone, or climb in high or dangerous places, such as on ladders, roofs, or girders.  Do not drive unless your doctor says you may.  3. If you have any warning that you may have a seizure, lay down in a safe place where you can't hurt yourself.    4.  No driving for 6 months from last seizure, as per [facility].   Please refer to the following link on the Epilepsy Foundation of America's website for more information: http://www.epilepsyfoundation.org/answerplace/Social/driving/drivingu.cfm   5.  Maintain good sleep hygiene. Avoid alcohol.  6.  Notify your neurology if you are planning pregnancy or if you become pregnant.  7.  Contact your doctor if you have any problems that may be related to the medicine you are taking.  8.  Call 911 and bring the patient back to the ED if:        A.  The seizure lasts longer than 5 minutes.       B.  The patient doesn't awaken shortly after the seizure  C.  The patient has new problems such as difficulty seeing, speaking or moving  D.  The patient was injured during the seizure  E.  The patient has a temperature over 102 F (39C)  F.  The patient vomited and now is having trouble breathing

## 2022-12-29 ENCOUNTER — Telehealth: Payer: Self-pay | Admitting: Neurology

## 2022-12-29 NOTE — Telephone Encounter (Signed)
Patient called stating that she is needing a Prior Auth for LevETRAcetam 250mg 

## 2023-01-02 ENCOUNTER — Other Ambulatory Visit: Payer: Self-pay

## 2023-01-02 MED ORDER — LEVETIRACETAM 250 MG PO TABS: ORAL_TABLET | ORAL | 1 refills | Status: DC

## 2023-01-09 ENCOUNTER — Other Ambulatory Visit (HOSPITAL_COMMUNITY): Payer: Self-pay

## 2023-02-12 ENCOUNTER — Other Ambulatory Visit (HOSPITAL_COMMUNITY): Payer: Self-pay

## 2023-02-12 ENCOUNTER — Telehealth: Payer: Self-pay | Admitting: Pharmacy Technician

## 2023-02-12 NOTE — Telephone Encounter (Signed)
PA has been submitted, and telephone encounter has been created. 

## 2023-02-12 NOTE — Telephone Encounter (Signed)
Pharmacy Patient Advocate Encounter   Received notification from Pt Calls Messages that prior authorization for LEVETIRACETEM 250MG  is required/requested.   Insurance verification completed.   The patient is insured through [facility] .   Per test claim: PA required; PA submitted to above mentioned insurance via CoverMyMeds Key/confirmation #/EOC [id number] Status is pending

## 2023-02-20 NOTE — Telephone Encounter (Signed)
Per insurance form (scanned into patients media) PA is not needed.

## 2023-06-01 ENCOUNTER — Other Ambulatory Visit: Payer: Self-pay | Admitting: Neurology

## 2023-12-02 ENCOUNTER — Other Ambulatory Visit: Payer: Self-pay | Admitting: Neurology

## 2023-12-11 ENCOUNTER — Ambulatory Visit (INDEPENDENT_AMBULATORY_CARE_PROVIDER_SITE_OTHER): Payer: 59 | Admitting: Neurology

## 2023-12-11 ENCOUNTER — Encounter: Payer: Self-pay | Admitting: Neurology

## 2023-12-11 VITALS — BP 127/70 | HR 112 | Ht 61.0 in | Wt 117.0 lb

## 2023-12-11 DIAGNOSIS — G40309 Generalized idiopathic epilepsy and epileptic syndromes, not intractable, without status epilepticus: Secondary | ICD-10-CM

## 2023-12-11 MED ORDER — LEVETIRACETAM 250 MG PO TABS: ORAL_TABLET | ORAL | 4 refills | Status: AC

## 2023-12-11 NOTE — Patient Instructions (Signed)
 It's always a pleasure to see you. Continue Keppra  250mg : take 2 tablets in AM, 3 tablets in PM. Follow-up in 1 year, call for any changes.    Seizure Precautions: 1. If medication has been prescribed for you to prevent seizures, take it exactly as directed.  Do not stop taking the medicine without talking to your doctor first, even if you have not had a seizure in a long time.   2. Avoid activities in which a seizure would cause danger to yourself or to others.  Don't operate dangerous machinery, swim alone, or climb in high or dangerous places, such as on ladders, roofs, or girders.  Do not drive unless your doctor says you may.  3. If you have any warning that you may have a seizure, lay down in a safe place where you can't hurt yourself.    4.  No driving for 6 months from last seizure, as per [place]  state law.   Please refer to the following link on the Epilepsy Foundation of America's website for more information: http://www.epilepsyfoundation.org/answerplace/Social/driving/drivingu.cfm   5.  Maintain good sleep hygiene. Avoid alcohol.  6.  Notify your neurology if you are planning pregnancy or if you become pregnant.  7.  Contact your doctor if you have any problems that may be related to the medicine you are taking.  8.  Call 911 and bring the patient back to the ED if:        A.  The seizure lasts longer than 5 minutes.       B.  The patient doesn't awaken shortly after the seizure  C.  The patient has new problems such as difficulty seeing, speaking or moving  D.  The patient was injured during the seizure  E.  The patient has a temperature over 102 F (39C)  F.  The patient vomited and now is having trouble breathing

## 2023-12-11 NOTE — Progress Notes (Signed)
 NEUROLOGY FOLLOW UP OFFICE NOTE  Cheryl [name] [id number] May 24, 1995  HISTORY OF PRESENT ILLNESS: I had the pleasure of seeing Cheryl [name] in follow-up in the neurology clinic on 12/11/2023.  The patient was last seen a year ago for Primary Generalized Epilepsy. She is alone in the office today. Records and images were personally reviewed where available.  Since her last visit, she had a seizure on 11/25/23. They were out of town for a game in [place] and she missed 2 doses of her Keppra . Her partner heard a loud noise from the bathroom and found her lying on her side across the tub, between the toilet and bathtub. She regained consciousness in less than 5 minutes. Friend reported she had intermittent loss of consciousness and appeared confused and disoriented during these episodes, but was able to respond to most questions.   She denies any staring/unresponsive episodes, gaps in time, olfactory/gustatory hallucinations, focal numbness/tingling/weakness, myoclonic jerks. No headaches, dizziness, vision changes. She gets 8 hours of sleep. She is on Levetiracetam  500mg  in AM, 750mg  in PM (250mg : 2 tabs in AM, 3 tabs in PM) without side effects. Mood is good. No alcohol. No pregnancy plans. 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    History on Initial Assessment 03/11/2019: This is a pleasant 28 year old right-handed woman presenting to establish adult epilepsy care. Records from her pediatric neurologist were reviewed and will be summarized below. Seizures started at age 28. She recalls the first seizure, she was not feeling well and took medication with soda on an empty stomach. She had been holding her baby cousin then woke up on the floor. Her aunt told her she had a seizure with light shaking. She saw pediatric neurologist Dr. Susen. Per notes, MRI brain in February 2008 was normal. She had an EEG in February 2008 and July 2011 which were normal. She was thought to have a generalized  seizure disorder, then had focal seizures involving twitching of her eyes and gazing at the ceiling. She was loaded with Dilantin at that time and switched to Depakote. She was later changed to Levetiracetam  due to continued seizures. She has had good seizure control when she is compliant with Levetiracetam . She did well for several years and medication taper was attempted in 2012, but after being off medication for one day, she had a seizure in 12/2010. She had a seizure in 06/2017 after missing 3 or 4 doses. She had 2 seizures in one day last 10/31/2017 witnessed by her wife that occurred in the setting of sleep deprivation. She believes this was her last seizure. She has occasional auras where her hands will get clammy and she spaces out just a tiny bit, something feels off for a second and her equilibrium starts to sway. She denies any recent staring/unresponsive episodes. She is usually lethargic and sleepy after a seizure. No nocturnal seizures. She denies any olfactory/gustatory hallucinations, focal numbness/tingling/weakness, myoclonic jerks. She has been on Levetiracetam  250mg  2 tabs in AM, 3 tabs in PM with no side effects. She feels it also equalizes her mood. She has noticed stress can also contribute to seizures. She is now in a stress-free environment and has missed doses with no seizure activity. Mood and sleep are good. She denies any headaches, dizziness, vision changes, neck/back pain, bowel/bladder dysfunction. No falls. She works in an [employer]. She is hoping for pregnancy within the next 2 years.   Epilepsy Risk Factors:  She had a normal birth and  early development.  There is no history of febrile convulsions, CNS infections such as meningitis/encephalitis, significant traumatic brain injury, neurosurgical procedures, or family history of seizures.  Laboratory Data: Keppra  level 07/05/19: 5.3  PAST MEDICAL HISTORY: Past Medical History:  Diagnosis Date   Fibroids    Seizures  (HCC)     MEDICATIONS: Current Outpatient Medications on File Prior to Visit  Medication Sig Dispense Refill   levETIRAcetam  (KEPPRA ) 250 MG tablet TAKE 2 TABLETS BY MOUTH EVERY  MORNING AND 3 TABLETS BY MOUTH  AT BEDTIME 450 tablet 0   No current facility-administered medications on file prior to visit.    ALLERGIES: Allergies  Allergen Reactions   Diastat Acudial [Diazepam] Other (See Comments)    Does not work well for Patient     FAMILY HISTORY: Family History  Problem Relation Age of Onset   Hypertension Maternal Grandmother    Diabetes Maternal Grandmother     SOCIAL HISTORY: Social History   Socioeconomic History   Marital status: Single    Spouse name: Cheryl [name]   Number of children: Not on file   Years of education: Not on file   Highest education level: Not on file  Occupational History   Not on file  Tobacco Use   Smoking status: Never   Smokeless tobacco: Never  Vaping Use   Vaping status: Never Used  Substance and Sexual Activity   Alcohol use: Not Currently    Comment: One or twice a month-Socially   Drug use: No   Sexual activity: Yes    Partners: Female  Other Topics Concern   Not on file  Social History Narrative   Cheryl [name] is currently enrolled at [employer] in her second year of college and works at [employer] 24 hours a week.   Cheryl [name] lives with her mother and sibling.   Cheryl [name] enjoys music, reading, and walking downtown.   Cheryl [name] is doing well in school.   Right handed    Social Drivers of Health   Financial Resource Strain: Low Risk  (04/06/2021)   Received from [facility]   Overall Financial Resource Strain (CARDIA)    Difficulty of Paying Living Expenses: Not very hard  Food Insecurity: Medium Risk (03/04/2022)   Received from [facility]   Hunger Vital Sign    Within the past 12 months, you worried that your food would run out before you got money to buy more: Sometimes true    Within the past 12 months, the food you bought just didn't  last and you didn't have money to get more. : Sometimes true  Transportation Needs: Not on file (03/04/2022)  Recent Concern: Transportation Needs - Unmet Transportation Needs (03/04/2022)   Received from [facility]    In the past 12 months, has lack of reliable transportation kept you from medical appointments, meetings, work or from getting things needed for daily living? : Yes  Physical Activity: Sufficiently Active (04/06/2021)   Received from [facility]   Exercise Vital Sign    On average, how many days per week do you engage in moderate to strenuous exercise (like a brisk walk)?: 5 days    On average, how many minutes do you engage in exercise at this level?: 60 min  Stress: No Stress Concern Present (04/06/2021)   Received from [facility] Occupational Stress [facility]    Feeling of Stress : Not at all  Social Connections: Unknown (08/23/2021)  Received from [facility]   Social Network    Social Network: Not on file  Intimate Partner Violence: Unknown (07/15/2021)   Received from [facility]   HITS    Physically Hurt: Not on file    Insult or Talk Down To: Not on file    Threaten Physical Harm: Not on file    Scream or Curse: Not on file     PHYSICAL EXAM: Vitals:   12/11/23 1436  BP: 127/70  Pulse: (!) 112  SpO2: 98%   General: No acute distress Head:  Normocephalic/atraumatic Skin/Extremities: No rash, no edema Neurological Exam: alert and awake. No aphasia or dysarthria. Fund of knowledge is appropriate.  Attention and concentration are normal.   Cranial nerves: Pupils equal, round. Extraocular movements intact with no nystagmus. Visual fields full.  No facial asymmetry.  Motor: Bulk and tone normal, muscle strength 5/5 throughout with no pronator drift.   Finger to nose testing intact.  Gait narrow-based and steady, able to tandem walk adequately.  Romberg negative.   IMPRESSION: This is  a pleasant 28 yo RH woman with a history of seizures since age 51 suggestive of primary generalized epilepsy. MRI brain unremarkable, EEG x 2 normal. She had a seizure on 11/25/23 again in the setting of missed doses of medication. We discussed increasing Levetiracetam  to 750mg  BID however she would like to stay on current dose of 500mg  in AM, 750mg  in PM and continue to work on medication compliance. We discussed [place] driving laws to stop driving after a seizure until 6 months seizure-free. Follow-up in 1 year, call for any changes.    Thank you for allowing me to participate in her care.  Please do not hesitate to call for any questions or concerns.    Darice Shivers, M.D.

## 2024-02-27 ENCOUNTER — Encounter: Payer: Self-pay | Admitting: Neurology

## 2024-12-10 ENCOUNTER — Ambulatory Visit: Admitting: Neurology
